# Patient Record
Sex: Male | Born: 1945 | Race: White | Hispanic: No | Smoking: Never smoker
Health system: Southern US, Community
[De-identification: ages and names within clinical notes are randomized; demographics above are authoritative.]

## PROBLEM LIST (undated history)

## (undated) DIAGNOSIS — I10 Essential (primary) hypertension: Secondary | ICD-10-CM

## (undated) DIAGNOSIS — K5792 Diverticulitis of intestine, part unspecified, without perforation or abscess without bleeding: Secondary | ICD-10-CM

## (undated) DIAGNOSIS — K219 Gastro-esophageal reflux disease without esophagitis: Secondary | ICD-10-CM

## (undated) DIAGNOSIS — Z8719 Personal history of other diseases of the digestive system: Secondary | ICD-10-CM

## (undated) DIAGNOSIS — N184 Chronic kidney disease, stage 4 (severe): Secondary | ICD-10-CM

## (undated) DIAGNOSIS — R222 Localized swelling, mass and lump, trunk: Secondary | ICD-10-CM

## (undated) DIAGNOSIS — I639 Cerebral infarction, unspecified: Secondary | ICD-10-CM

## (undated) DIAGNOSIS — M109 Gout, unspecified: Secondary | ICD-10-CM

## (undated) DIAGNOSIS — F419 Anxiety disorder, unspecified: Secondary | ICD-10-CM

## (undated) DIAGNOSIS — Z8711 Personal history of peptic ulcer disease: Secondary | ICD-10-CM

## (undated) DIAGNOSIS — E7800 Pure hypercholesterolemia, unspecified: Secondary | ICD-10-CM

## (undated) DIAGNOSIS — R011 Cardiac murmur, unspecified: Secondary | ICD-10-CM

## (undated) DIAGNOSIS — N289 Disorder of kidney and ureter, unspecified: Secondary | ICD-10-CM

---

## 1978-10-07 HISTORY — PX: KIDNEY SURGERY: SHX687

## 2000-05-13 ENCOUNTER — Ambulatory Visit (HOSPITAL_COMMUNITY): Admission: RE | Admit: 2000-05-13 | Discharge: 2000-05-13 | Payer: Self-pay | Admitting: Internal Medicine

## 2001-08-06 ENCOUNTER — Emergency Department (HOSPITAL_COMMUNITY): Admission: EM | Admit: 2001-08-06 | Discharge: 2001-08-06 | Payer: Self-pay | Admitting: Emergency Medicine

## 2004-09-19 ENCOUNTER — Ambulatory Visit (HOSPITAL_COMMUNITY): Admission: RE | Admit: 2004-09-19 | Discharge: 2004-09-19 | Payer: Self-pay | Admitting: Pulmonary Disease

## 2004-11-02 ENCOUNTER — Ambulatory Visit (HOSPITAL_COMMUNITY): Admission: RE | Admit: 2004-11-02 | Discharge: 2004-11-02 | Payer: Self-pay | Admitting: Nephrology

## 2008-01-14 ENCOUNTER — Observation Stay (HOSPITAL_COMMUNITY): Admission: EM | Admit: 2008-01-14 | Discharge: 2008-01-15 | Payer: Self-pay | Admitting: Emergency Medicine

## 2009-11-21 ENCOUNTER — Ambulatory Visit (HOSPITAL_COMMUNITY): Admission: RE | Admit: 2009-11-21 | Discharge: 2009-11-21 | Payer: Self-pay | Admitting: Pulmonary Disease

## 2010-06-20 NOTE — H&P (Signed)
NAMETERRAN, KLINKE                ACCOUNT NO.:  [id number]   MEDICAL RECORD NO.:  [id number]          PATIENT TYPE:  INP   LOCATION:  A323                          FACILITY:  [facility]   PHYSICIAN:  Edward L. Juanetta Gosling, M.D.DATE OF BIRTH:  1945-04-29   DATE OF ADMISSION:  01/14/2008  DATE OF DISCHARGE:  LH                              HISTORY & PHYSICAL   REASON FOR ADMISSION:  Syncope, probably secondary to gastroenteritis.   HISTORY:  Mr. Brule is a 65 year old, who came to the emergency room  because he passed out.  He says that he has been having nausea and  vomiting and diarrhea for about 48 hours.  He went to the bathroom and  passed out.  This after an episode of vomiting.  He says that he did not  have any change in his level of consciousness, after that he felt okay.  He did not have any seizure as far as he could tell.  Did not lose  continence.   PAST MEDICAL HISTORY:  Positive for hypertension, gastroesophageal  reflux disease, chronic renal failure.   SOCIAL HISTORY:  He is a nonsmoker.  Does not use any alcohol.  No  history of illicit drug use.  He apparently had some sort of a growth on  his kidney and I am not quite sure what that represents.   MEDICATIONS:  1. Hydrochlorothiazide 10 mg, I believe that must be 12.5 mg daily.  2. Omeprazole 20 mg daily.  3. Amlodipine 10 mg daily.  4. Enalapril 20 mg b.i.d.  5. Allopurinol 100 mg daily.  6. Hyzaar 100/25 mg daily.  7. Zocor 40 mg daily.  8. Added Prevacid, since he has been sick.   FAMILY HISTORY:  His mother has hypertension and is blind from macular  degeneration.   PHYSICAL EXAMINATION:  GENERAL:  Awake and alert, says that he feels  bad, he is still nauseated.  VITAL SIGNS:  Blood pressure 110/70, pulse 70 and regular, respirations  are 16.  He is afebrile.  His pupils are reactive.  His nose and throat  are clear.  Mucous membranes are dry.  NECK:  Supple without masses.  CHEST:  Rhonchi bilaterally.  HEART:  Regular without murmur, gallop, or rub.  ABDOMEN:  Mildly tender, diffusely with hyperactive bowel sounds.  EXTREMITIES:  No edema.  CENTRAL NERVOUS SYSTEM:  Grossly intact.   LAB WORK:  White count is 15200, hemoglobin is 15, platelets 162, BUN is  29, creatinine 2.2, amylase is pending.   Chest x-ray shows what looks like a large hiatal hernia, some  cardiomegaly.   ASSESSMENT:  It sounds like he has probably had syncope on the basis of  having had a gastroenteritis with nausea, vomiting, and diarrhea.  His  renal function does not look normal.  His white blood count is up.  I am  going to treat him conservatively for now with fluids.  I am not going  to put him on any antibiotics.  He is going to have a Zofran for nausea,  put him on a clear liquid diet, add  Protonix.  I am going to check an  amylase.  Recheck his CBC and see him in the morning, and if he has not  clinically improved, he will need to have a CT abdomen and pelvis.      Edward L. Juanetta Gosling, M.D.  Electronically Signed     ELH/MEDQ  D:  01/14/2008  T:  01/15/2008  Job:  [id number]

## 2010-06-20 NOTE — Group Therapy Note (Signed)
Charles Zuniga, Charles Zuniga                ACCOUNT NO.:  [id number]   MEDICAL RECORD NO.:  [id number]          PATIENT TYPE:  INP   LOCATION:  A323                          FACILITY:  [facility]   PHYSICIAN:  Edward L. Juanetta Gosling, M.D.DATE OF BIRTH:  04-23-1945   DATE OF PROCEDURE:  DATE OF DISCHARGE:                                 PROGRESS NOTE   HISTORY OF PRESENT ILLNESS:  Mr. Loppnow was brought in for observation  yesterday with what appeared to be a gastroenteritis.  He says he is  better.  He is not having any nausea.  He has not had any vomiting.  He  has not had any diarrhea.   PHYSICAL EXAMINATION:  GENERAL:  He looks more comfortable.  He is awake  and alert.  VITAL SIGNS:  Temperature is 98.7, pulse 71, respirations 20, blood  pressure 149/72, and O2 sat is 96%.  CHEST:  His chest is clear.  ABDOMEN:  Soft, and he looks much more comfortable.   LABORATORY DATA:  His white count has come down to 13,400 from 15,000,  and his renal function is marginally better from a BUN of 29 to a BUN of  26.  Creatinine from 2.2 to 2.17.  His CO2 which had been low is much  improved at 24.   ASSESSMENT:  He is better.   PLAN:  If he continues his improvement, I think he might be able to be  discharged later today.  I am going to advance his diet, get him up, and  see how he does.      Edward L. Juanetta Gosling, M.D.  Electronically Signed     ELH/MEDQ  D:  01/15/2008  T:  01/15/2008  Job:  [id number]

## 2010-06-23 NOTE — Discharge Summary (Signed)
NAMEADOLF, ORMISTON                ACCOUNT NO.:  [id number]   MEDICAL RECORD NO.:  [id number]          PATIENT TYPE:  OBV   LOCATION:  A323                          FACILITY:  [facility]   PHYSICIAN:  Edward L. Juanetta Gosling, M.D.DATE OF BIRTH:  Dec 22, 1945   DATE OF ADMISSION:  01/14/2008  DATE OF DISCHARGE:  12/10/2009LH                               DISCHARGE SUMMARY   FINAL DISCHARGE DIAGNOSES:  1. Syncope.  2. Gastroenteritis.  3. Dehydration.  4. Hypertension.  5. Gastroesophageal reflux disease.  6. Gout.  7. Hyperlipidemia.  8. Moderate chronic renal failure.   HISTORY:  Mr. Gartrell is a 65 year old who came to the emergency room  after passing out.  He has had nausea, vomiting and diarrhea for about  48 hours, went to the bathroom and passed out.  Before that, he had no  change in level of consciousness.  No seizure activity, etc.  He was  brought to the emergency room were he was noted to be awake and alert  but nauseated.  Blood pressure was 110/70, pulse was 70 and regular.  He  was afebrile.  His mucous membranes were dry.  His nose and throat were  clear.  The rest per his history and physical.  His white blood count  was 15,200, BUN was 29, creatinine 2.2.   HOSPITAL COURSE:  He was treated with IV fluids, antiemetics, and showed  marked improvement.  The next morning, his white blood count was  significantly better, and he looked significantly better.  He was able  to eat, he was able to keep his food down, and he was discharged to  continue with his previous medications which are:  1. Hydrochlorothiazide 12.5 mg daily.  2. Omeprazole 20 mg daily.  3. Amlodipine 10 mg daily.  4. Enalapril 20 mg b.i.d.  5. Allopurinol 100 mg daily.  6. Hyzaar 100/25 daily.  7. Zocor 40 mg daily.   He will follow up in my office in about a month.     Edward L. Juanetta Gosling, M.D.  Electronically Signed    ELH/MEDQ  D:  01/16/2008  T:  01/16/2008  Job:  [id number]

## 2010-10-05 ENCOUNTER — Other Ambulatory Visit (HOSPITAL_COMMUNITY): Payer: Self-pay | Admitting: Pulmonary Disease

## 2010-10-05 ENCOUNTER — Encounter (HOSPITAL_COMMUNITY): Payer: Self-pay

## 2010-10-05 ENCOUNTER — Ambulatory Visit (HOSPITAL_COMMUNITY)
Admission: RE | Admit: 2010-10-05 | Discharge: 2010-10-05 | Disposition: A | Discharge status: Home / Self Care | Payer: Medicare Other | Source: Ambulatory Visit | Attending: Pulmonary Disease | Admitting: Pulmonary Disease

## 2010-10-05 DIAGNOSIS — R269 Unspecified abnormalities of gait and mobility: Secondary | ICD-10-CM

## 2010-10-05 DIAGNOSIS — R42 Dizziness and giddiness: Secondary | ICD-10-CM

## 2010-10-05 HISTORY — DX: Essential (primary) hypertension: I10

## 2010-11-10 LAB — BASIC METABOLIC PANEL
CO2: 18 mEq/L — ABNORMAL LOW (ref 19–32)
Chloride: 110 mEq/L (ref 96–112)
GFR calc Af Amer: 37 mL/min — ABNORMAL LOW (ref >60)
Potassium: 4 mEq/L (ref 3.5–5.1)
Sodium: 139 mEq/L (ref 135–145)

## 2010-11-10 LAB — CBC
HCT: 43.5 % (ref 39.0–52.0)
Hemoglobin: 15 g/dL (ref 13.0–17.0)
MCHC: 33.7 g/dL (ref 30.0–36.0)
MCHC: 34.4 g/dL (ref 30.0–36.0)
MCV: 91.3 fL (ref 78.0–100.0)
MCV: 92.9 fL (ref 78.0–100.0)
Platelets: 162 10*3/uL (ref 150–400)
RBC: 4.77 MIL/uL (ref 4.22–5.81)
WBC: 13.4 10*3/uL — ABNORMAL HIGH (ref 4.0–10.5)

## 2010-11-10 LAB — POCT CARDIAC MARKERS
CKMB, poc: 1.5 ng/mL (ref 1.0–8.0)
Myoglobin, poc: 266 ng/mL (ref 12–200)

## 2010-11-10 LAB — DIFFERENTIAL
Basophils Relative: 0 % (ref 0–1)
Eosinophils Absolute: 0.1 10*3/uL (ref 0.0–0.7)
Eosinophils Relative: 1 % (ref 0–5)
Eosinophils Relative: 4 % (ref 0–5)
Lymphocytes Relative: 10 % — ABNORMAL LOW (ref 12–46)
Lymphs Abs: 1.3 10*3/uL (ref 0.7–4.0)
Monocytes Absolute: 1 10*3/uL (ref 0.1–1.0)
Monocytes Absolute: 1 10*3/uL (ref 0.1–1.0)
Monocytes Relative: 7 % (ref 3–12)
Neutro Abs: 13.7 10*3/uL — ABNORMAL HIGH (ref 1.7–7.7)

## 2010-11-10 LAB — COMPREHENSIVE METABOLIC PANEL
AST: 23 U/L (ref 0–37)
Albumin: 3 g/dL — ABNORMAL LOW (ref 3.5–5.2)
Calcium: 8.7 mg/dL (ref 8.4–10.5)
Chloride: 112 mEq/L (ref 96–112)
Creatinine, Ser: 2.17 mg/dL — ABNORMAL HIGH (ref 0.4–1.5)
GFR calc Af Amer: 37 mL/min — ABNORMAL LOW (ref >60)
Total Protein: 5.6 g/dL — ABNORMAL LOW (ref 6.0–8.3)

## 2010-11-10 LAB — URINALYSIS, ROUTINE W REFLEX MICROSCOPIC
Bilirubin Urine: NEGATIVE
Specific Gravity, Urine: 1.02 (ref 1.005–1.030)
Urobilinogen, UA: 0.2 mg/dL (ref 0.0–1.0)

## 2010-11-10 LAB — URINE MICROSCOPIC-ADD ON

## 2011-03-14 ENCOUNTER — Other Ambulatory Visit: Payer: Self-pay

## 2011-03-14 ENCOUNTER — Emergency Department (HOSPITAL_COMMUNITY)
Admission: EM | Admit: 2011-03-14 | Discharge: 2011-03-14 | Disposition: A | Discharge status: Home / Self Care | Payer: Medicare Other | Attending: Emergency Medicine | Admitting: Emergency Medicine

## 2011-03-14 ENCOUNTER — Encounter (HOSPITAL_COMMUNITY): Payer: Self-pay | Admitting: *Deleted

## 2011-03-14 DIAGNOSIS — I10 Essential (primary) hypertension: Secondary | ICD-10-CM

## 2011-03-14 DIAGNOSIS — N289 Disorder of kidney and ureter, unspecified: Secondary | ICD-10-CM | POA: Insufficient documentation

## 2011-03-14 DIAGNOSIS — R42 Dizziness and giddiness: Secondary | ICD-10-CM | POA: Insufficient documentation

## 2011-03-14 HISTORY — DX: Disorder of kidney and ureter, unspecified: N28.9

## 2011-03-14 LAB — BASIC METABOLIC PANEL
BUN: 26 mg/dL — ABNORMAL HIGH (ref 6–23)
CO2: 25 mEq/L (ref 19–32)
Calcium: 10 mg/dL (ref 8.4–10.5)
Creatinine, Ser: 2.34 mg/dL — ABNORMAL HIGH (ref 0.50–1.35)

## 2011-03-14 LAB — CBC
HCT: 43.3 % (ref 39.0–52.0)
MCHC: 33.3 g/dL (ref 30.0–36.0)
MCV: 90.8 fL (ref 78.0–100.0)
RDW: 13.7 % (ref 11.5–15.5)

## 2011-03-14 LAB — URINALYSIS, ROUTINE W REFLEX MICROSCOPIC
Bilirubin Urine: NEGATIVE
Glucose, UA: 100 mg/dL — AB
Ketones, ur: NEGATIVE mg/dL
Leukocytes, UA: NEGATIVE
Protein, ur: >300 mg/dL — AB

## 2011-03-14 LAB — DIFFERENTIAL
Basophils Absolute: 0.1 10*3/uL (ref 0.0–0.1)
Basophils Relative: 1 % (ref 0–1)
Eosinophils Relative: 6 % — ABNORMAL HIGH (ref 0–5)
Lymphocytes Relative: 16 % (ref 12–46)
Monocytes Absolute: 0.6 10*3/uL (ref 0.1–1.0)

## 2011-03-14 LAB — URINE MICROSCOPIC-ADD ON

## 2011-03-14 MED ORDER — METOPROLOL TARTRATE 1 MG/ML IV SOLN
5.0000 mg | Freq: Once | INTRAVENOUS | Status: AC
  Administered 2011-03-14: 5 mg via INTRAVENOUS
  Filled 2011-03-14: qty 5

## 2011-03-14 MED ORDER — CLONIDINE HCL 0.1 MG PO TABS
0.1000 mg | ORAL_TABLET | Freq: Once | ORAL | Status: AC
  Administered 2011-03-14: 0.1 mg via ORAL
  Filled 2011-03-14: qty 1

## 2011-03-14 MED ORDER — CLONIDINE HCL 0.2 MG PO TABS
0.1000 mg | ORAL_TABLET | Freq: Two times a day (BID) | ORAL | Status: DC
Start: 2011-03-14 — End: 2011-07-27

## 2011-03-14 MED ORDER — SODIUM CHLORIDE 0.9 % IV SOLN
Freq: Once | INTRAVENOUS | Status: AC
  Administered 2011-03-14: 15:00:00 via INTRAVENOUS

## 2011-03-14 NOTE — ED Notes (Signed)
BP improved.  Pt denies complaints at present time.  No needs verbalized at present.

## 2011-03-14 NOTE — ED Provider Notes (Signed)
This chart was scribed for [employer]. Colon Branch, MD by Wallis Mart. The patient was seen in room APA05/APA05 and the patient's care was started at 3:47 PM.   CSN: [id number]  Arrival date & time 03/14/11  1432   First MD Initiated Contact with Patient 03/14/11 1507      Chief Complaint  Patient presents with  . Hypertension    (Consider location/radiation/quality/duration/timing/severity/associated sxs/prior treatment) HPI Charles Zuniga is a 66 y.o. male who presents to the Emergency Department complaining of intermittent hypertension (240/108) that was noticed this morning at his kidney MD appt. PT bp is currently 139/118.  Pt last checked bp 3 weeks ago, and it was a "little high". Pt takes Hydralizine.  Pt was taken off 4  bp medications after switching PCPs.  Pt c/o dizzy spells to the extent that he couldn't walk for a month.  Pt still c/o dizziness in the morning.    PCP: Dr Creta Levin [facility] [place]) Kidney doctor: Dr Fausto Skillern  Past Medical History  Diagnosis Date  . Hypertension   . Renal disorder     Past Surgical History  Procedure Date  . Kidney surgery     History reviewed. No pertinent family history.  History  Substance Use Topics  . Smoking status: Never Smoker   . Smokeless tobacco: Not on file  . Alcohol Use: No      Review of Systems  10 Systems reviewed and are negative for acute change except as noted in the HPI.  Allergies  Review of patient's allergies indicates no known allergies.  Home Medications   Current Outpatient Rx  Name Route Sig Dispense Refill  . ALLOPURINOL 100 MG PO TABS Oral Take 200 mg by mouth every morning.    Marland Kitchen HYDRALAZINE HCL 50 MG PO TABS Oral Take 50 mg by mouth 2 (two) times daily.    Marland Kitchen PANTOPRAZOLE SODIUM 40 MG PO TBEC Oral Take 40 mg by mouth every morning.      BP 220/105  Pulse 72  Temp(Src) 97.5 F (36.4 C) (Oral)  Resp 20  Ht 5\' 9"  (1.753 m)  Wt 179 lb (81.194 kg)  BMI 26.43 kg/m2  SpO2 100%  Physical  Exam  Nursing note and vitals reviewed. Constitutional: He is oriented to person, place, and time. He appears well-developed and well-nourished. No distress.  HENT:  Head: Normocephalic and atraumatic.       Poor dentition  Eyes: EOM are normal. Pupils are equal, round, and reactive to light.  Neck: Normal range of motion. Neck supple. No tracheal deviation present.       No bruits  Cardiovascular: Normal rate, regular rhythm and normal heart sounds.  Exam reveals no friction rub.   No murmur heard. Pulmonary/Chest: Effort normal. No respiratory distress. He has no wheezes. He has no rales.        Few fine crackles at bases of lungs  Abdominal: Soft. Bowel sounds are normal. He exhibits no distension.  Musculoskeletal: Normal range of motion. He exhibits no edema.       excoriated erythemas rash to both shins constistant with eczema  Neurological: He is alert and oriented to person, place, and time. No sensory deficit.  Skin: Skin is warm and dry.  Psychiatric: He has a normal mood and affect. His behavior is normal.    ED Course  Procedures (including critical care time) DIAGNOSTIC STUDIES: Oxygen Saturation is 100% on room air, normal by my interpretation.    COORDINATION OF CARE:  3:55 PM: Physical exam complete  6:22 PM: EDP at pt bedside, PT BP currently 199/184  8:03 PM: EDP at pt bedside.  Current BP: 118/52.  EDP discuss potential medications and course of treatment.    Results for orders placed during the hospital encounter of 03/14/11  CBC      Component Value Range   WBC 6.1  4.0 - 10.5 (K/uL)   RBC 4.77  4.22 - 5.81 (MIL/uL)   Hemoglobin 14.4  13.0 - 17.0 (g/dL)   HCT 13.0  86.5 - 78.4 (%)   MCV 90.8  78.0 - 100.0 (fL)   MCH 30.2  26.0 - 34.0 (pg)   MCHC 33.3  30.0 - 36.0 (g/dL)   RDW 69.6  29.5 - 28.4 (%)   Platelets 156  150 - 400 (K/uL)  DIFFERENTIAL      Component Value Range   Neutrophils Relative 67  43 - 77 (%)   Neutro Abs 4.1  1.7 - 7.7 (K/uL)    Lymphocytes Relative 16  12 - 46 (%)   Lymphs Abs 1.0  0.7 - 4.0 (K/uL)   Monocytes Relative 11  3 - 12 (%)   Monocytes Absolute 0.6  0.1 - 1.0 (K/uL)   Eosinophils Relative 6 (*) 0 - 5 (%)   Eosinophils Absolute 0.3  0.0 - 0.7 (K/uL)   Basophils Relative 1  0 - 1 (%)   Basophils Absolute 0.1  0.0 - 0.1 (K/uL)  BASIC METABOLIC PANEL      Component Value Range   Sodium 139  135 - 145 (mEq/L)   Potassium 3.7  3.5 - 5.1 (mEq/L)   Chloride 104  96 - 112 (mEq/L)   CO2 25  19 - 32 (mEq/L)   Glucose, Bld 91  70 - 99 (mg/dL)   BUN 26 (*) 6 - 23 (mg/dL)   Creatinine, Ser 1.32 (*) 0.50 - 1.35 (mg/dL)   Calcium 44.0  8.4 - 10.5 (mg/dL)   GFR calc non Af Amer 27 (*) >90 (mL/min)   GFR calc Af Amer 32 (*) >90 (mL/min)     Date: 03/14/2011  1508  Rate: 67  Rhythm: normal sinus rhythm  QRS Axis: normal  Intervals: normal  ST/T Wave abnormalities: normal  Conduction Disutrbances:none  Narrative Interpretation:   Old EKG Reviewed: none available      MDM  Patient with renal insufficiency and HTN  Here with elevated blood pressure while at the nephrology office and sent to the ER for evaluation. Patient had previously been on multiple medicines for HTN and was taken off all but hydralazine by his new PCP. Now with BP 256/110. Denies headache, vision changes, trouble speaking or swallowing. Given lopressor  And clonidine with improvement in the blood pressure. Will send home on clonidine daily and allow PCP to regulate BP.The patient appears reasonably screened and/or stabilized for discharge and I doubt any other medical condition or other [facility] requiring further screening, evaluation, or treatment in the ED at this time prior to discharge.    I personally performed the services described in this documentation, which was scribed in my presence. The recorded information has been reviewed and considered.  MDM Reviewed: vitals and nursing note Interpretation: labs      Nicoletta Dress. Colon Branch,  MD 03/14/11 2023

## 2011-03-14 NOTE — ED Notes (Addendum)
Pt states that he went to his kidney MD and his blood pressure 240/108. Pt told to come to ED. Pt also c/o dizziness when he first gets up in the morning.

## 2011-07-27 ENCOUNTER — Emergency Department (HOSPITAL_COMMUNITY): Payer: Medicare Other

## 2011-07-27 ENCOUNTER — Emergency Department (HOSPITAL_COMMUNITY)
Admission: EM | Admit: 2011-07-27 | Discharge: 2011-07-27 | Disposition: A | Discharge status: Home / Self Care | Payer: Medicare Other | Source: Home / Self Care | Attending: Emergency Medicine | Admitting: Emergency Medicine

## 2011-07-27 ENCOUNTER — Encounter (HOSPITAL_COMMUNITY): Payer: Self-pay

## 2011-07-27 DIAGNOSIS — I10 Essential (primary) hypertension: Secondary | ICD-10-CM | POA: Insufficient documentation

## 2011-07-27 DIAGNOSIS — R61 Generalized hyperhidrosis: Secondary | ICD-10-CM | POA: Insufficient documentation

## 2011-07-27 DIAGNOSIS — K5732 Diverticulitis of large intestine without perforation or abscess without bleeding: Secondary | ICD-10-CM | POA: Insufficient documentation

## 2011-07-27 DIAGNOSIS — K5792 Diverticulitis of intestine, part unspecified, without perforation or abscess without bleeding: Secondary | ICD-10-CM

## 2011-07-27 DIAGNOSIS — R197 Diarrhea, unspecified: Secondary | ICD-10-CM | POA: Insufficient documentation

## 2011-07-27 DIAGNOSIS — R110 Nausea: Secondary | ICD-10-CM | POA: Insufficient documentation

## 2011-07-27 DIAGNOSIS — Z79899 Other long term (current) drug therapy: Secondary | ICD-10-CM | POA: Insufficient documentation

## 2011-07-27 DIAGNOSIS — R040 Epistaxis: Secondary | ICD-10-CM | POA: Insufficient documentation

## 2011-07-27 DIAGNOSIS — R1032 Left lower quadrant pain: Secondary | ICD-10-CM | POA: Insufficient documentation

## 2011-07-27 LAB — PROTIME-INR
INR: 0.91 (ref 0.00–1.49)
Prothrombin Time: 12.5 seconds (ref 11.6–15.2)

## 2011-07-27 LAB — CBC
HCT: 39.4 % (ref 39.0–52.0)
MCHC: 34.8 g/dL (ref 30.0–36.0)
MCV: 90.4 fL (ref 78.0–100.0)
RDW: 13.4 % (ref 11.5–15.5)

## 2011-07-27 LAB — COMPREHENSIVE METABOLIC PANEL
AST: 18 U/L (ref 0–37)
Albumin: 4.5 g/dL (ref 3.5–5.2)
Calcium: 10.7 mg/dL — ABNORMAL HIGH (ref 8.4–10.5)
Creatinine, Ser: 3.14 mg/dL — ABNORMAL HIGH (ref 0.50–1.35)

## 2011-07-27 LAB — DIFFERENTIAL
Basophils Absolute: 0.1 10*3/uL (ref 0.0–0.1)
Basophils Relative: 1 % (ref 0–1)
Eosinophils Relative: 4 % (ref 0–5)
Monocytes Absolute: 0.9 10*3/uL (ref 0.1–1.0)
Neutro Abs: 5.9 10*3/uL (ref 1.7–7.7)

## 2011-07-27 MED ORDER — HYDROMORPHONE HCL PF 1 MG/ML IJ SOLN
1.0000 mg | Freq: Once | INTRAMUSCULAR | Status: AC
  Administered 2011-07-27: 1 mg via INTRAVENOUS
  Filled 2011-07-27: qty 1

## 2011-07-27 MED ORDER — ONDANSETRON HCL 4 MG/2ML IJ SOLN
4.0000 mg | Freq: Once | INTRAMUSCULAR | Status: AC
  Administered 2011-07-27: 4 mg via INTRAVENOUS
  Filled 2011-07-27: qty 2

## 2011-07-27 MED ORDER — HYDROCODONE-ACETAMINOPHEN 5-325 MG PO TABS: 1.0000 | ORAL_TABLET | Freq: Four times a day (QID) | ORAL | Status: DC | PRN

## 2011-07-27 MED ORDER — AMOXICILLIN-POT CLAVULANATE 875-125 MG PO TABS: 1.0000 | ORAL_TABLET | Freq: Two times a day (BID) | ORAL | Status: DC

## 2011-07-27 MED ORDER — SODIUM CHLORIDE 0.9 % IV SOLN
3.0000 g | Freq: Once | INTRAVENOUS | Status: AC
  Administered 2011-07-27: 3 g via INTRAVENOUS
  Filled 2011-07-27: qty 3

## 2011-07-27 MED ORDER — SODIUM CHLORIDE 0.9 % IV SOLN
INTRAVENOUS | Status: DC
  Administered 2011-07-27: 500 mL via INTRAVENOUS

## 2011-07-27 MED ORDER — SODIUM CHLORIDE 0.9 % IV BOLUS (SEPSIS)
500.0000 mL | Freq: Once | INTRAVENOUS | Status: AC
  Administered 2011-07-27: 500 mL via INTRAVENOUS

## 2011-07-27 NOTE — ED Provider Notes (Addendum)
History    This chart was scribed for Charles Jakes, MD, MD by Charles Zuniga. The patient was seen in room APA15 and the patient's care was started at 10:20AM.   CSN: [id number]  Arrival date & time 07/27/11  1191   First MD Initiated Contact with Patient 07/27/11 1002      Chief Complaint  Patient presents with  . Diarrhea  . Abdominal Pain    (Consider location/radiation/quality/duration/timing/severity/associated sxs/prior treatment) The history is provided by the patient.   Charles Zuniga is a 66 y.o. male who presents to the Emergency Department complaining of moderate left lower quadrant abdominal pain and diarrhea onset 2 days. Pain is sharp. Pain has been constant without radiation. Pt has nausea. Denies vomiting. Pt had nose bleed this morning. He has had diaphoresis last night while he was sleeping. Denies blood in his stool.  PCP Dr. Creta Levin in [place]   Past Medical History  Diagnosis Date  . Hypertension   . Renal disorder     Past Surgical History  Procedure Date  . Kidney surgery     No family history on file.  History  Substance Use Topics  . Smoking status: Never Smoker   . Smokeless tobacco: Not on file  . Alcohol Use: No      Review of Systems  Constitutional: Negative for fever and chills.  HENT: Negative for congestion, sore throat and neck pain.   Respiratory: Negative for shortness of breath.   Gastrointestinal: Positive for nausea, abdominal pain and diarrhea. Negative for vomiting.  Genitourinary: Negative for dysuria.  Musculoskeletal: Negative for back pain.  Skin: Negative for rash.    Allergies  Clonidine derivatives  Home Medications   Current Outpatient Rx  Name Route Sig Dispense Refill  . ALLOPURINOL 100 MG PO TABS Oral Take 200 mg by mouth every morning.    Marland Kitchen HYDRALAZINE HCL 50 MG PO TABS Oral Take 50 mg by mouth 2 (two) times daily.    Marland Kitchen PANTOPRAZOLE SODIUM 40 MG PO TBEC Oral Take 40 mg by mouth every morning.     Marland Kitchen AMOXICILLIN-POT CLAVULANATE 875-125 MG PO TABS Oral Take 1 tablet by mouth every 12 (twelve) hours. 14 tablet 0  . HYDROCODONE-ACETAMINOPHEN 5-325 MG PO TABS Oral Take 1-2 tablets by mouth every 6 (six) hours as needed for pain. 10 tablet 0    BP 133/63  Pulse 65  Temp 98.4 F (36.9 C) (Oral)  Resp 18  SpO2 97%  Physical Exam  Nursing note and vitals reviewed. Constitutional: He is oriented to person, place, and time. He appears well-developed and well-nourished. No distress.  HENT:  Head: Normocephalic and atraumatic.       Clot in left nare   Eyes: EOM are normal.  Cardiovascular: Normal rate, regular rhythm and normal heart sounds.   Pulmonary/Chest: Effort normal and breath sounds normal. No respiratory distress.  Abdominal: Soft. Bowel sounds are normal. He exhibits no distension. There is tenderness (LLQ).  Neurological: He is alert and oriented to person, place, and time.  Skin: Skin is warm and dry.  Psychiatric: He has a normal mood and affect. His behavior is normal.    ED Course  Procedures (including critical care time) DIAGNOSTIC STUDIES: Oxygen Saturation is 99% on room air, normal by my interpretation.    COORDINATION OF CARE: 10:25AM EDP discusses pt ED treatment with pt 10:32AM EDP orders medication: 0.9% NaCl infusion, Zofran 4 mg, dilaudid 1 mg 12:47PM EDP rechecks pt and discusses pt  lab/imaging results with pt.    Labs Reviewed  COMPREHENSIVE METABOLIC PANEL - Abnormal; Notable for the following:    Glucose, Bld 111 (*)     BUN 47 (*)     Creatinine, Ser 3.14 (*)     Calcium 10.7 (*)     Total Protein 8.7 (*)     GFR calc non Af Amer 19 (*)     GFR calc Af Amer 22 (*)     All other components within normal limits  CBC  DIFFERENTIAL  LIPASE, BLOOD  PROTIME-INR   Ct Abdomen Pelvis Wo Contrast  07/27/2011  *RADIOLOGY REPORT*  Clinical Data: Left-sided abdominal pain for 3 days.  CT ABDOMEN AND PELVIS WITHOUT CONTRAST  Technique:   Multidetector CT imaging of the abdomen and pelvis was performed following the standard protocol without intravenous contrast.  Comparison: 11/21/2009  Findings: There is focal diverticulitis at the junction of the descending and sigmoid portions of the colon with pericolonic inflammation but no abscess or perforation.  The patient has extensive diverticula in the descending and sigmoid portions of the colon.  The liver, spleen, pancreas, adrenal glands, and kidneys demonstrate no acute abnormalities.  There is deformity of the right kidney with a cyst on the upper pole.  The appearance is unchanged since the prior exam.  Appendix is normal.  There is a right inguinal hernia containing only fat.  IMPRESSION: Focal acute diverticulitis of the distal descending colon.  No perforation or abscess.  Extensive diverticulosis of the left side of the colon.  Original Report Authenticated By: Gwynn Burly, M.D.   Results for orders placed during the hospital encounter of 07/27/11  CBC      Component Value Range   WBC 8.4  4.0 - 10.5 K/uL   RBC 4.36  4.22 - 5.81 MIL/uL   Hemoglobin 13.7  13.0 - 17.0 g/dL   HCT 16.1  09.6 - 04.5 %   MCV 90.4  78.0 - 100.0 fL   MCH 31.4  26.0 - 34.0 pg   MCHC 34.8  30.0 - 36.0 g/dL   RDW 40.9  81.1 - 91.4 %   Platelets 192  150 - 400 K/uL  DIFFERENTIAL      Component Value Range   Neutrophils Relative 70  43 - 77 %   Neutro Abs 5.9  1.7 - 7.7 K/uL   Lymphocytes Relative 14  12 - 46 %   Lymphs Abs 1.1  0.7 - 4.0 K/uL   Monocytes Relative 11  3 - 12 %   Monocytes Absolute 0.9  0.1 - 1.0 K/uL   Eosinophils Relative 4  0 - 5 %   Eosinophils Absolute 0.4  0.0 - 0.7 K/uL   Basophils Relative 1  0 - 1 %   Basophils Absolute 0.1  0.0 - 0.1 K/uL  COMPREHENSIVE METABOLIC PANEL      Component Value Range   Sodium 139  135 - 145 mEq/L   Potassium 3.8  3.5 - 5.1 mEq/L   Chloride 99  96 - 112 mEq/L   CO2 23  19 - 32 mEq/L   Glucose, Bld 111 (*) 70 - 99 mg/dL   BUN 47 (*)  6 - 23 mg/dL   Creatinine, Ser 7.82 (*) 0.50 - 1.35 mg/dL   Calcium 95.6 (*) 8.4 - 10.5 mg/dL   Total Protein 8.7 (*) 6.0 - 8.3 g/dL   Albumin 4.5  3.5 - 5.2 g/dL   AST 18  0 -  37 U/L   ALT 14  0 - 53 U/L   Alkaline Phosphatase 88  39 - 117 U/L   Total Bilirubin 0.7  0.3 - 1.2 mg/dL   GFR calc non Af Amer 19 (*) >90 mL/min   GFR calc Af Amer 22 (*) >90 mL/min  LIPASE, BLOOD      Component Value Range   Lipase 36  11 - 59 U/L  PROTIME-INR      Component Value Range   Prothrombin Time 12.5  11.6 - 15.2 seconds   INR 0.91  0.00 - 1.49     1. Diverticulitis       MDM  CT consistent with uncomplicated diverticulitis treated with 3 g of Unasyn in the emergency department we'll continue Augmentin at home for the next 7 days. Patient is qualified to be treated as an outpatient patient's had a history of a renal disorder the increased BUN and creatinine from baseline may be consistent with some dehydration patient given fluid in the emergency department. Has primary care Dr. followup next week knows to return if not better in 2 days or for new worse symptoms. History of diabetes blood sugar here today is 111 well-controlled.   I personally performed the services described in this documentation, which was scribed in my presence. The recorded information has been reviewed and considered.     Charles Jakes, MD 07/27/11 1322probably not  Charles Jakes, MD 07/27/11 2104

## 2011-07-27 NOTE — ED Notes (Signed)
Patient is comfortable at this time. 

## 2011-07-27 NOTE — ED Notes (Signed)
Family at bedside. 

## 2011-07-27 NOTE — ED Notes (Signed)
Pt reports left lower ab pain and diarrhea for 2-3 days, denies any fever no n/v.  Also had nosebleed this am, bp has been high and pmd is aware and is treating.

## 2011-07-27 NOTE — Discharge Instructions (Signed)
Diverticulitis Small pockets or "bubbles" can develop in the wall of the intestine. Diverticulitis is when those pockets become infected and inflamed. This causes stomach pain (usually on the left side). HOME CARE  Take all medicine as told by your doctor.   Try a clear liquid diet (broth, tea, or water) for as long as told by your doctor.   Keep all follow-up visits with your doctor.   You may be put on a low-fiber diet once you start feeling better. Here are foods that have low-fiber:   White breads, cereals, rice, and pasta.   Cooked fruits and vegetables or soft fresh fruits and vegetables without the skin.   Ground or well-cooked tender beef, ham, veal, lamb, pork, or poultry.   Eggs and seafood.   After you are doing well on the low-fiber diet, you may be put on a high-fiber diet. Here are ways to increase your fiber:   Choose whole-grain breads, cereals, pasta, and brown rice.   Choose fruits and vegetables with skin on. Do not overcook the vegetables.   Choose nuts, seeds, legumes, dried peas, beans, and lentils.   Look for food products that have more than 3 grams of fiber per serving on the food label.  GET HELP RIGHT AWAY IF:  Your pain does not get better or gets worse.   You have trouble eating food.   You are not pooping (having bowel movements) like normal.   You have a temperature by mouth above 102 F (38.9 C), not controlled by medicine.   You keep throwing up (vomiting).   You have bloody or black, tarry poop (stools).   You are getting worse and not better.  MAKE SURE YOU:   Understand these instructions.   Will watch your condition.   Will get help right away if you are not doing well or get worse.  Document Released: 07/11/2007 Document Revised: 01/11/2011 Document Reviewed: 12/13/2008 [facility] Patient Information 2012 [place], [place].  followup with your regular Dr.in the next few days. Take antibiotic as directed take pain medicine as  directed. You should be better or improving significantly in 2 days if not she needs a followup and return here for new or worse symptoms. Definitely need to followup with your primary care Dr. Next week sometime.

## 2011-07-27 NOTE — ED Notes (Signed)
Pt is resting in bed --resp even awaiting dischage

## 2011-07-28 ENCOUNTER — Inpatient Hospital Stay (HOSPITAL_COMMUNITY)
Admission: EM | Admit: 2011-07-28 | Discharge: 2011-07-30 | DRG: 392 | Disposition: A | Discharge status: Home / Self Care | Payer: Medicare Other | Attending: Internal Medicine | Admitting: Internal Medicine

## 2011-07-28 ENCOUNTER — Encounter (HOSPITAL_COMMUNITY): Payer: Self-pay

## 2011-07-28 DIAGNOSIS — Z8639 Personal history of other endocrine, nutritional and metabolic disease: Secondary | ICD-10-CM

## 2011-07-28 DIAGNOSIS — E860 Dehydration: Secondary | ICD-10-CM | POA: Diagnosis present

## 2011-07-28 DIAGNOSIS — I10 Essential (primary) hypertension: Secondary | ICD-10-CM

## 2011-07-28 DIAGNOSIS — E119 Type 2 diabetes mellitus without complications: Secondary | ICD-10-CM | POA: Diagnosis present

## 2011-07-28 DIAGNOSIS — K5732 Diverticulitis of large intestine without perforation or abscess without bleeding: Secondary | ICD-10-CM

## 2011-07-28 DIAGNOSIS — Z862 Personal history of diseases of the blood and blood-forming organs and certain disorders involving the immune mechanism: Secondary | ICD-10-CM

## 2011-07-28 DIAGNOSIS — I129 Hypertensive chronic kidney disease with stage 1 through stage 4 chronic kidney disease, or unspecified chronic kidney disease: Secondary | ICD-10-CM | POA: Diagnosis present

## 2011-07-28 DIAGNOSIS — N189 Chronic kidney disease, unspecified: Secondary | ICD-10-CM

## 2011-07-28 DIAGNOSIS — K219 Gastro-esophageal reflux disease without esophagitis: Secondary | ICD-10-CM | POA: Diagnosis present

## 2011-07-28 DIAGNOSIS — N184 Chronic kidney disease, stage 4 (severe): Secondary | ICD-10-CM | POA: Diagnosis present

## 2011-07-28 DIAGNOSIS — R1032 Left lower quadrant pain: Secondary | ICD-10-CM | POA: Diagnosis present

## 2011-07-28 DIAGNOSIS — E785 Hyperlipidemia, unspecified: Secondary | ICD-10-CM | POA: Diagnosis present

## 2011-07-28 DIAGNOSIS — K5792 Diverticulitis of intestine, part unspecified, without perforation or abscess without bleeding: Secondary | ICD-10-CM

## 2011-07-28 DIAGNOSIS — R109 Unspecified abdominal pain: Secondary | ICD-10-CM

## 2011-07-28 HISTORY — DX: Diverticulitis of intestine, part unspecified, without perforation or abscess without bleeding: K57.92

## 2011-07-28 HISTORY — DX: Gout, unspecified: M10.9

## 2011-07-28 HISTORY — DX: Chronic kidney disease, stage 4 (severe): N18.4

## 2011-07-28 LAB — CBC
Hemoglobin: 14 g/dL (ref 13.0–17.0)
RBC: 4.46 MIL/uL (ref 4.22–5.81)

## 2011-07-28 LAB — DIFFERENTIAL
Basophils Relative: 1 % (ref 0–1)
Lymphs Abs: 1 10*3/uL (ref 0.7–4.0)
Monocytes Relative: 10 % (ref 3–12)
Neutro Abs: 4.3 10*3/uL (ref 1.7–7.7)
Neutrophils Relative %: 67 % (ref 43–77)

## 2011-07-28 LAB — BASIC METABOLIC PANEL
BUN: 44 mg/dL — ABNORMAL HIGH (ref 6–23)
Chloride: 96 mEq/L (ref 96–112)
GFR calc Af Amer: 23 mL/min — ABNORMAL LOW (ref >90)
Glucose, Bld: 117 mg/dL — ABNORMAL HIGH (ref 70–99)
Potassium: 4 mEq/L (ref 3.5–5.1)

## 2011-07-28 MED ORDER — SODIUM CHLORIDE 0.9 % IV SOLN
Freq: Once | INTRAVENOUS | Status: AC
  Administered 2011-07-28: 20 mL/h via INTRAVENOUS

## 2011-07-28 MED ORDER — ONDANSETRON HCL 4 MG/2ML IJ SOLN
4.0000 mg | Freq: Four times a day (QID) | INTRAMUSCULAR | Status: DC | PRN
  Administered 2011-07-29: 4 mg via INTRAVENOUS
  Filled 2011-07-28: qty 2

## 2011-07-28 MED ORDER — ALLOPURINOL 100 MG PO TABS
200.0000 mg | ORAL_TABLET | Freq: Every morning | ORAL | Status: DC
  Administered 2011-07-29 – 2011-07-30 (×2): 200 mg via ORAL
  Filled 2011-07-28 (×2): qty 2

## 2011-07-28 MED ORDER — MORPHINE SULFATE 4 MG/ML IJ SOLN
4.0000 mg | Freq: Once | INTRAMUSCULAR | Status: AC
  Administered 2011-07-28: 4 mg via INTRAVENOUS
  Filled 2011-07-28: qty 1

## 2011-07-28 MED ORDER — ONDANSETRON HCL 4 MG PO TABS: 4.0000 mg | ORAL_TABLET | Freq: Four times a day (QID) | ORAL | Status: DC | PRN

## 2011-07-28 MED ORDER — METRONIDAZOLE IN NACL 5-0.79 MG/ML-% IV SOLN
INTRAVENOUS | Status: AC
  Filled 2011-07-28: qty 200

## 2011-07-28 MED ORDER — MORPHINE SULFATE 2 MG/ML IJ SOLN: 1.0000 mg | INTRAMUSCULAR | Status: DC | PRN

## 2011-07-28 MED ORDER — CIPROFLOXACIN IN D5W 400 MG/200ML IV SOLN
INTRAVENOUS | Status: AC
  Filled 2011-07-28: qty 200

## 2011-07-28 MED ORDER — SODIUM CHLORIDE 0.9 % IV SOLN
3.0000 g | Freq: Once | INTRAVENOUS | Status: AC
  Administered 2011-07-28: 3 g via INTRAVENOUS
  Filled 2011-07-28: qty 3

## 2011-07-28 MED ORDER — METRONIDAZOLE IN NACL 5-0.79 MG/ML-% IV SOLN
500.0000 mg | Freq: Three times a day (TID) | INTRAVENOUS | Status: DC
  Administered 2011-07-28 – 2011-07-30 (×5): 500 mg via INTRAVENOUS
  Filled 2011-07-28 (×6): qty 100

## 2011-07-28 MED ORDER — OXYCODONE-ACETAMINOPHEN 5-325 MG PO TABS
1.0000 | ORAL_TABLET | Freq: Four times a day (QID) | ORAL | Status: DC | PRN
  Administered 2011-07-28 – 2011-07-29 (×2): 2 via ORAL
  Filled 2011-07-28 (×3): qty 2

## 2011-07-28 MED ORDER — HYDRALAZINE HCL 25 MG PO TABS
50.0000 mg | ORAL_TABLET | Freq: Two times a day (BID) | ORAL | Status: DC
  Administered 2011-07-28 – 2011-07-30 (×4): 50 mg via ORAL
  Filled 2011-07-28: qty 1
  Filled 2011-07-28 (×4): qty 2

## 2011-07-28 MED ORDER — POTASSIUM CHLORIDE IN NACL 20-0.9 MEQ/L-% IV SOLN
INTRAVENOUS | Status: AC
  Administered 2011-07-28: 23:00:00 via INTRAVENOUS

## 2011-07-28 MED ORDER — CIPROFLOXACIN IN D5W 400 MG/200ML IV SOLN
400.0000 mg | Freq: Two times a day (BID) | INTRAVENOUS | Status: DC
  Administered 2011-07-28: 400 mg via INTRAVENOUS
  Filled 2011-07-28 (×4): qty 200

## 2011-07-28 MED ORDER — PANTOPRAZOLE SODIUM 40 MG PO TBEC
40.0000 mg | DELAYED_RELEASE_TABLET | Freq: Every morning | ORAL | Status: DC
  Administered 2011-07-29 – 2011-07-30 (×2): 40 mg via ORAL
  Filled 2011-07-28 (×2): qty 1

## 2011-07-28 MED ORDER — CIPROFLOXACIN IN D5W 400 MG/200ML IV SOLN
400.0000 mg | Freq: Once | INTRAVENOUS | Status: DC
  Filled 2011-07-28: qty 200

## 2011-07-28 MED ORDER — ONDANSETRON HCL 4 MG/2ML IJ SOLN
4.0000 mg | Freq: Once | INTRAMUSCULAR | Status: AC
  Administered 2011-07-28: 4 mg via INTRAVENOUS
  Filled 2011-07-28: qty 2

## 2011-07-28 NOTE — ED Notes (Signed)
Pt states he was here last night for abd pain. States pain is worse today

## 2011-07-28 NOTE — ED Provider Notes (Signed)
History   This chart was scribed for Joya Gaskins, MD by Melba Coon. The patient was seen in room APA12/APA12 and the patient's care was started at 6:52PM.    CSN: [id number]  Arrival date & time 07/28/11  1629   First MD Initiated Contact with Patient 07/28/11 1821      Chief Complaint  Patient presents with  . Abdominal Pain     HPI Charles Zuniga is a 66 y.o. male who presents to the Emergency Department complaining of constant, moderate to severe abdominal pain with an onset 2 days ago. Pt has Hx of abd problems; was in the ED yesterday for similar issues; pain has not gotten better since last visit. No abd surgeries.  Nausea present. No diarrhea today. No HA, fever, neck pain, sore throat, rash, back pain, CP, SOB, emesis, dysuria, or extremity pain, edema, weakness, numbness, or tingling. Allergic to clonidine derivatives.  Nothing improves his pain  PCP: Dr. Creta Levin at [facility]  Past Medical History  Diagnosis Date  . Hypertension   . Renal disorder     Past Surgical History  Procedure Date  . Kidney surgery     History reviewed. No pertinent family history.  History  Substance Use Topics  . Smoking status: Never Smoker   . Smokeless tobacco: Not on file  . Alcohol Use: No      Review of Systems 10 Systems reviewed and all are negative for acute change except as noted in the HPI.   Allergies  Clonidine derivatives  Home Medications   Current Outpatient Rx  Name Route Sig Dispense Refill  . ALLOPURINOL 100 MG PO TABS Oral Take 200 mg by mouth every morning.    Marland Kitchen AMOXICILLIN-POT CLAVULANATE 875-125 MG PO TABS Oral Take 1 tablet by mouth every 12 (twelve) hours. 14 tablet 0  . HYDRALAZINE HCL 50 MG PO TABS Oral Take 50 mg by mouth 2 (two) times daily.    Marland Kitchen HYDROCODONE-ACETAMINOPHEN 5-325 MG PO TABS Oral Take 1-2 tablets by mouth every 6 (six) hours as needed for pain. 10 tablet 0  . PANTOPRAZOLE SODIUM 40 MG PO TBEC Oral Take 40 mg by mouth  every morning.      BP 156/84  Pulse 78  Temp 97.9 F (36.6 C) (Oral)  Resp 24  Ht 5\' 11"  (1.803 m)  Wt 172 lb (78.019 kg)  BMI 23.99 kg/m2  SpO2 98%  Physical Exam CONSTITUTIONAL: Well developed/well nourished HEAD AND FACE: Normocephalic/atraumatic EYES: EOMI/PERRL ENMT: Mucous membranes moist NECK: supple no meningeal signs SPINE:entire spine nontender CV: S1/S2 noted, no murmurs/rubs/gallops noted LUNGS: Lungs are clear to auscultation bilaterally, no apparent distress ABDOMEN: soft, no rebound or guarding; LLQ pain, tenderness is moderate.   GU:no cva tenderness NEURO: Pt is awake/alert, moves all extremitiesx4 EXTREMITIES: pulses normal, full ROM SKIN: warm, color normal PSYCH: no abnormalities of mood noted   ED Course  Procedures   DIAGNOSTIC STUDIES: Oxygen Saturation is 99% on room air, normal by my interpretation.    COORDINATION OF CARE:  6:57PM - EDMD will order pain meds, abx, UA, and blood w/u for the pt. EDMD recommends pt for  Admittance; pt agrees with EDMD Pt still with recurrent pain, reporting nausea, but not toxic appearing.  Given h/o renal failure, reports can't tolerate PO meds will admit for IV pain meds and IV antibiotics.   D/w dr Onalee Hua, triad will admit Further imaging not warranted at this time   Labs Reviewed  BASIC METABOLIC PANEL -  Abnormal; Notable for the following:    Sodium 134 (*)     Glucose, Bld 117 (*)     BUN 44 (*)     Creatinine, Ser 3.02 (*)     Calcium 11.2 (*)     GFR calc non Af Amer 20 (*)     GFR calc Af Amer 23 (*)     All other components within normal limits  CBC  DIFFERENTIAL  LACTIC ACID, PLASMA   Ct Abdomen Pelvis Wo Contrast  07/27/2011  *RADIOLOGY REPORT*  Clinical Data: Left-sided abdominal pain for 3 days.  CT ABDOMEN AND PELVIS WITHOUT CONTRAST  Technique:  Multidetector CT imaging of the abdomen and pelvis was performed following the standard protocol without intravenous contrast.  Comparison:  11/21/2009  Findings: There is focal diverticulitis at the junction of the descending and sigmoid portions of the colon with pericolonic inflammation but no abscess or perforation.  The patient has extensive diverticula in the descending and sigmoid portions of the colon.  The liver, spleen, pancreas, adrenal glands, and kidneys demonstrate no acute abnormalities.  There is deformity of the right kidney with a cyst on the upper pole.  The appearance is unchanged since the prior exam.  Appendix is normal.  There is a right inguinal hernia containing only fat.  IMPRESSION: Focal acute diverticulitis of the distal descending colon.  No perforation or abscess.  Extensive diverticulosis of the left side of the colon.  Original Report Authenticated By: Gwynn Burly, M.D.      MDM  Nursing notes including past medical history and social history reviewed and considered in documentation All labs/vitals reviewed and considered Previous records reviewed and considered - recent CT showed diverticulitis without perforation   I personally performed the services described in this documentation, which was scribed in my presence. The recorded information has been reviewed and considered.        Joya Gaskins, MD 07/28/11 2012

## 2011-07-28 NOTE — ED Notes (Signed)
C/o nausea - states present all day.

## 2011-07-28 NOTE — H&P (Signed)
PCP:   STALLINGS,SHEILA, MD   Chief Complaint:  Abdominal pain  HPI: 66 year old male who has chronic kidney disease and hypertension comes in with 2 days of left lower quadrant abdominal pain. He was seen yesterday in the emergency department and was diagnosed with diverticulitis sent home with Augmentin and Lortab. He is taking 2 doses of the Augmentin and one dose of Lortab and still had persistent pain so came back to the emergency department. He is not having any nausea vomiting and is tolerating by mouth. He is having diarrhea that is nonbloody. He denies any fevers at home. He had been asked to admit the patient for persistent abdominal pain.  Review of Systems:  Otherwise negative  Past Medical History: Past Medical History  Diagnosis Date  . Hypertension   . Renal disorder    Past Surgical History  Procedure Date  . Kidney surgery     Medications: Prior to Admission medications   Medication Sig Start Date End Date Taking? Authorizing Provider  allopurinol (ZYLOPRIM) 100 MG tablet Take 200 mg by mouth every morning.    Historical Provider, MD  amoxicillin-clavulanate (AUGMENTIN) 875-125 MG per tablet Take 1 tablet by mouth every 12 (twelve) hours. 07/27/11 08/06/11  [name], MD  hydrALAZINE (APRESOLINE) 50 MG tablet Take 50 mg by mouth 2 (two) times daily.    Historical Provider, MD  HYDROcodone-acetaminophen (NORCO) 5-325 MG per tablet Take 1-2 tablets by mouth every 6 (six) hours as needed for pain. 07/27/11 08/06/11  [name], MD  pantoprazole (PROTONIX) 40 MG tablet Take 40 mg by mouth every morning.    Historical Provider, MD    Allergies:   Allergies  Allergen Reactions  . Clonidine Derivatives Other (See Comments)    Hallucinations, overall feels "really bad"    Social History:  reports that he has never smoked. He does not have any smokeless tobacco history on file. He reports that he does not drink alcohol or use illicit drugs.  Family  History: History reviewed. No pertinent family history.  Physical Exam: Filed Vitals:   07/28/11 1632 07/28/11 1917  BP: 188/84 156/84  Pulse: 87 78  Temp: 97.6 F (36.4 C) 97.9 F (36.6 C)  TempSrc: Oral Oral  Resp: 18 24  Height: 5\' 11"  (1.803 m)   Weight: 78.019 kg (172 lb)   SpO2: 99% 98%   General appearance: alert, cooperative and no distress Neck: no JVD and supple, symmetrical, trachea midline Lungs: clear to auscultation bilaterally Heart: regular rate and rhythm, S1, S2 normal, no murmur, click, rub or gallop Abdomen: soft, non-tender; bowel sounds normal; no masses,  no organomegaly Extremities: extremities normal, atraumatic, no cyanosis or edema Pulses: 2+ and symmetric Skin: Skin color, texture, turgor normal. No rashes or lesions Neurologic: Grossly normal    Labs on Admission:   [facility] 07/28/11 1903 07/27/11 1044  NA 134* 139  K 4.0 3.8  CL 96 99  CO2 23 23  GLUCOSE 117* 111*  BUN 44* 47*  CREATININE 3.02* 3.14*  CALCIUM 11.2* 10.7*  MG -- --  PHOS -- --    Basename 07/27/11 1044  AST 18  ALT 14  ALKPHOS 88  BILITOT 0.7  PROT 8.7*  ALBUMIN 4.5    Basename 07/27/11 1044  LIPASE 36  AMYLASE --    Basename 07/28/11 1903 07/27/11 1044  WBC 6.4 8.4  NEUTROABS 4.3 5.9  HGB 14.0 13.7  HCT 39.8 39.4  MCV 89.2 90.4  PLT 190 192  Radiological Exams on Admission: Ct Abdomen Pelvis Wo Contrast  07/27/2011  *RADIOLOGY REPORT*  Clinical Data: Left-sided abdominal pain for 3 days.  CT ABDOMEN AND PELVIS WITHOUT CONTRAST  Technique:  Multidetector CT imaging of the abdomen and pelvis was performed following the standard protocol without intravenous contrast.  Comparison: 11/21/2009  Findings: There is focal diverticulitis at the junction of the descending and sigmoid portions of the colon with pericolonic inflammation but no abscess or perforation.  The patient has extensive diverticula in the descending and sigmoid portions of the colon.  The  liver, spleen, pancreas, adrenal glands, and kidneys demonstrate no acute abnormalities.  There is deformity of the right kidney with a cyst on the upper pole.  The appearance is unchanged since the prior exam.  Appendix is normal.  There is a right inguinal hernia containing only fat.  IMPRESSION: Focal acute diverticulitis of the distal descending colon.  No perforation or abscess.  Extensive diverticulosis of the left side of the colon.  Original Report Authenticated By: Gwynn Burly, M.D.    Assessment/Plan Present on Admission:  66 year old male with acute diverticulitis  .Abdominal pain, acute, left lower quadrant .Diverticulitis of colon .CKD (chronic kidney disease) .Hypertension  For his acute diverticulitis and the stitches pain medicine to Percocet with some IV morphine for severe breakthrough pain. Place him on ciprofloxacin and Flagyl. His abdominal exam is benign. Also of note he is under the care of the renal doctor in [place] who has told him that he would never be a candidate for dialysis in the future which is unclear to me. He's also previously been under the care of Dr. Mckinley Jewel who was in the process of getting him access to start dialysis in the future. Since he is getting conflicting recommendations from different nephrologist and he does wish to do dialysis in the future if needed I recommended for him to start seeing Dr Mckinley Jewel again. His creatinine right now is at his baseline of 3 and he is currently not uremic. We'll provide him with some gentle IV hydration overnight.  Kimmie Berggren A [phone] 07/28/2011, 8:08 PM

## 2011-07-29 DIAGNOSIS — R109 Unspecified abdominal pain: Secondary | ICD-10-CM

## 2011-07-29 DIAGNOSIS — I10 Essential (primary) hypertension: Secondary | ICD-10-CM

## 2011-07-29 DIAGNOSIS — N189 Chronic kidney disease, unspecified: Secondary | ICD-10-CM

## 2011-07-29 DIAGNOSIS — K5732 Diverticulitis of large intestine without perforation or abscess without bleeding: Secondary | ICD-10-CM

## 2011-07-29 LAB — BASIC METABOLIC PANEL
CO2: 24 mEq/L (ref 19–32)
Calcium: 10 mg/dL (ref 8.4–10.5)
Creatinine, Ser: 3.21 mg/dL — ABNORMAL HIGH (ref 0.50–1.35)
Glucose, Bld: 106 mg/dL — ABNORMAL HIGH (ref 70–99)

## 2011-07-29 LAB — CBC
HCT: 35.3 % — ABNORMAL LOW (ref 39.0–52.0)
MCH: 31.5 pg (ref 26.0–34.0)
MCV: 90.3 fL (ref 78.0–100.0)
Platelets: 180 10*3/uL (ref 150–400)
RDW: 13.3 % (ref 11.5–15.5)

## 2011-07-29 MED ORDER — ACETAMINOPHEN 325 MG PO TABS
650.0000 mg | ORAL_TABLET | Freq: Four times a day (QID) | ORAL | Status: DC | PRN
  Administered 2011-07-29 (×2): 650 mg via ORAL
  Filled 2011-07-29 (×2): qty 2

## 2011-07-29 MED ORDER — ONDANSETRON HCL 4 MG/2ML IJ SOLN
4.0000 mg | INTRAMUSCULAR | Status: DC | PRN
  Administered 2011-07-29: 4 mg via INTRAVENOUS
  Filled 2011-07-29: qty 2

## 2011-07-29 MED ORDER — SODIUM CHLORIDE 0.9 % IJ SOLN
INTRAMUSCULAR | Status: AC
  Filled 2011-07-29: qty 3

## 2011-07-29 MED ORDER — PROMETHAZINE HCL 25 MG/ML IJ SOLN
12.5000 mg | Freq: Four times a day (QID) | INTRAMUSCULAR | Status: DC | PRN
  Administered 2011-07-29 (×2): 12.5 mg via INTRAVENOUS
  Filled 2011-07-29 (×2): qty 1

## 2011-07-29 MED ORDER — CIPROFLOXACIN IN D5W 400 MG/200ML IV SOLN
400.0000 mg | INTRAVENOUS | Status: DC
  Administered 2011-07-29 (×2): 400 mg via INTRAVENOUS
  Filled 2011-07-29: qty 200

## 2011-07-29 NOTE — Progress Notes (Signed)
Notified Dr. Randol Kern notified due to pts daughters concerns of pt becoming nauseated due to the percocet.  She asked if the med could be changed to Tylenol and plus the phenergan worked better for him in the past.  New orders given and followed.

## 2011-07-29 NOTE — Consult Note (Signed)
ANTIBIOTIC CONSULT NOTE - INITIAL  Pharmacy Consult for Cipro Indication: possible abdominal infection, diverticulitis  Allergies  Allergen Reactions  . Clonidine Derivatives Other (See Comments)    Hallucinations, overall feels "really bad"   Patient Measurements: Height: 5\' 11"  (180.3 cm) Weight: 172 lb 8 oz (78.245 kg) IBW/kg (Calculated) : 75.3   Vital Signs: Temp: 97.5 F (36.4 C) (06/23 0500) Temp src: Oral (06/23 0500) BP: 145/67 mmHg (06/23 0500) Pulse Rate: 62  (06/23 0500) Intake/Output from previous day: 06/22 0701 - 06/23 0700 In: 781.3 [I.V.:481.3; IV Piggyback:300] Out: 500 [Urine:500] Intake/Output from this shift:    Labs:  Basename 07/29/11 0451 07/28/11 1903 07/27/11 1044  WBC 6.1 6.4 8.4  HGB 12.3* 14.0 13.7  PLT 180 190 192  LABCREA -- -- --  CREATININE 3.21* 3.02* 3.14*   Estimated Creatinine Clearance: 24.1 ml/min (by C-G formula based on Cr of 3.21). No results found for this basename: VANCOTROUGH:2,VANCOPEAK:2,VANCORANDOM:2,GENTTROUGH:2,GENTPEAK:2,GENTRANDOM:2,TOBRATROUGH:2,TOBRAPEAK:2,TOBRARND:2,AMIKACINPEAK:2,AMIKACINTROU:2,AMIKACIN:2, in the last 72 hours   Microbiology: No results found for this or any previous visit (from the past 720 hour(s)).  Medical History: Past Medical History  Diagnosis Date  . Hypertension   . Renal disorder   . Gout     occasional   Medications:  Scheduled:    . sodium chloride   Intravenous Once  . allopurinol  200 mg Oral q morning - 10a  . ampicillin-sulbactam (UNASYN) IV  3 g Intravenous Once  . ciprofloxacin  400 mg Intravenous Q24H  . hydrALAZINE  50 mg Oral BID  . metronidazole  500 mg Intravenous Q8H  .  morphine injection  4 mg Intravenous Once  . ondansetron  4 mg Intravenous Once  . pantoprazole  40 mg Oral q morning - 10a  . DISCONTD: ciprofloxacin  400 mg Intravenous Q12H  . DISCONTD: ciprofloxacin  400 mg Intravenous Once   Assessment: 67yo male with chronic kidney dz, c/o abdominal  pain Estimated Creatinine Clearance: 24.1 ml/min (by C-G formula based on Cr of 3.21).  Goal of Therapy:  Eradicate infection.  Plan: Cipro 400mg  iv q24hrs Labs per protocol  Valrie Hart A 07/29/2011,9:13 AM

## 2011-07-29 NOTE — Progress Notes (Signed)
Notified Dr. Nobie Putnam about pts continuing c/o nausea.  Recommended to him to change the Zofran to Q4hrs instead of 6.  New orders put in.

## 2011-07-29 NOTE — Progress Notes (Signed)
Subjective: This plan was admitted yesterday with sigmoid diverticulitis. He says he is in some pain. He has not opened his bowels.           Physical Exam: Blood pressure 145/67, pulse 62, temperature 97.5 F (36.4 C), temperature source Oral, resp. rate 20, height 5\' 11"  (1.803 m), weight 78.245 kg (172 lb 8 oz), SpO2 97.00%. He looks systemically well. Is not toxic or septic. His abdomen is soft and tender in a generalized fashion, with more tenderness in the left low quadrant. His abdomen is not rigid. Bowel sounds are heard and clinically he does not have an acute abdomen. Lung fields are clear. He is alert and orientated.        Basic Metabolic Panel:  Basename 07/29/11 0451 07/28/11 1903  NA 135 134*  K 4.2 4.0  CL 99 96  CO2 24 23  GLUCOSE 106* 117*  BUN 44* 44*  CREATININE 3.21* 3.02*  CALCIUM 10.0 11.2*  MG -- --  PHOS -- --   Liver Function Tests:  Basename 07/27/11 1044  AST 18  ALT 14  ALKPHOS 88  BILITOT 0.7  PROT 8.7*  ALBUMIN 4.5     CBC:  Basename 07/29/11 0451 07/28/11 1903 07/27/11 1044  WBC 6.1 6.4 --  NEUTROABS -- 4.3 5.9  HGB 12.3* 14.0 --  HCT 35.3* 39.8 --  MCV 90.3 89.2 --  PLT 180 190 --    Ct Abdomen Pelvis Wo Contrast  07/27/2011  *RADIOLOGY REPORT*  Clinical Data: Left-sided abdominal pain for 3 days.  CT ABDOMEN AND PELVIS WITHOUT CONTRAST  Technique:  Multidetector CT imaging of the abdomen and pelvis was performed following the standard protocol without intravenous contrast.  Comparison: 11/21/2009  Findings: There is focal diverticulitis at the junction of the descending and sigmoid portions of the colon with pericolonic inflammation but no abscess or perforation.  The patient has extensive diverticula in the descending and sigmoid portions of the colon.  The liver, spleen, pancreas, adrenal glands, and kidneys demonstrate no acute abnormalities.  There is deformity of the right kidney with a cyst on the upper pole.  The  appearance is unchanged since the prior exam.  Appendix is normal.  There is a right inguinal hernia containing only fat.  IMPRESSION: Focal acute diverticulitis of the distal descending colon.  No perforation or abscess.  Extensive diverticulosis of the left side of the colon.  Original Report Authenticated By: Gwynn Burly, M.D.      Medications:  Scheduled:   . sodium chloride   Intravenous Once  . allopurinol  200 mg Oral q morning - 10a  . ampicillin-sulbactam (UNASYN) IV  3 g Intravenous Once  . ciprofloxacin  400 mg Intravenous Q24H  . hydrALAZINE  50 mg Oral BID  . metronidazole  500 mg Intravenous Q8H  .  morphine injection  4 mg Intravenous Once  . ondansetron  4 mg Intravenous Once  . pantoprazole  40 mg Oral q morning - 10a  . DISCONTD: ciprofloxacin  400 mg Intravenous Q12H  . DISCONTD: ciprofloxacin  400 mg Intravenous Once    Impression: 1. Sigmoid diverticulitis. 2. Chronic kidney disease. 3. Hypertension.     Plan: 1. Continue with intravenous antibiotics and analgesia as required. 2. Nephrology consultation for his chronic kidney disease. I think he would be a good candidate for dialysis if he wishes to have it and when he is appropriate for it.     LOS: 1 day   [facility]  C Pager [phone]  07/29/2011, 9:32 AM

## 2011-07-30 ENCOUNTER — Encounter (HOSPITAL_COMMUNITY): Payer: Self-pay | Admitting: Internal Medicine

## 2011-07-30 DIAGNOSIS — N184 Chronic kidney disease, stage 4 (severe): Secondary | ICD-10-CM | POA: Diagnosis present

## 2011-07-30 DIAGNOSIS — K5732 Diverticulitis of large intestine without perforation or abscess without bleeding: Secondary | ICD-10-CM

## 2011-07-30 DIAGNOSIS — I10 Essential (primary) hypertension: Secondary | ICD-10-CM

## 2011-07-30 DIAGNOSIS — R109 Unspecified abdominal pain: Secondary | ICD-10-CM

## 2011-07-30 DIAGNOSIS — N189 Chronic kidney disease, unspecified: Secondary | ICD-10-CM

## 2011-07-30 LAB — CBC
Hemoglobin: 11.3 g/dL — ABNORMAL LOW (ref 13.0–17.0)
MCH: 31.1 pg (ref 26.0–34.0)
RBC: 3.63 MIL/uL — ABNORMAL LOW (ref 4.22–5.81)
WBC: 5.4 10*3/uL (ref 4.0–10.5)

## 2011-07-30 LAB — COMPREHENSIVE METABOLIC PANEL
ALT: 10 U/L (ref 0–53)
Alkaline Phosphatase: 62 U/L (ref 39–117)
CO2: 22 mEq/L (ref 19–32)
Calcium: 9.3 mg/dL (ref 8.4–10.5)
Chloride: 106 mEq/L (ref 96–112)
GFR calc Af Amer: 22 mL/min — ABNORMAL LOW (ref >90)
GFR calc non Af Amer: 19 mL/min — ABNORMAL LOW (ref >90)
Glucose, Bld: 91 mg/dL (ref 70–99)
Potassium: 4.4 mEq/L (ref 3.5–5.1)
Sodium: 140 mEq/L (ref 135–145)
Total Bilirubin: 0.5 mg/dL (ref 0.3–1.2)

## 2011-07-30 MED ORDER — PROMETHAZINE HCL 12.5 MG PO TABS
12.5000 mg | ORAL_TABLET | Freq: Four times a day (QID) | ORAL | Status: DC | PRN
Start: 2011-07-30 — End: 2012-05-27

## 2011-07-30 MED ORDER — CIPROFLOXACIN HCL 250 MG PO TABS: 500.0000 mg | ORAL_TABLET | Freq: Every day | ORAL | Status: DC

## 2011-07-30 MED ORDER — SODIUM CHLORIDE 0.9 % IV SOLN: INTRAVENOUS | Status: DC

## 2011-07-30 MED ORDER — ACETAMINOPHEN 325 MG PO TABS: 650.0000 mg | ORAL_TABLET | Freq: Four times a day (QID) | ORAL | Status: DC | PRN

## 2011-07-30 MED ORDER — CIPROFLOXACIN HCL 500 MG PO TABS: 500.0000 mg | ORAL_TABLET | Freq: Every day | ORAL | Status: AC

## 2011-07-30 MED ORDER — METRONIDAZOLE 500 MG PO TABS
500.0000 mg | ORAL_TABLET | Freq: Three times a day (TID) | ORAL | Status: DC
  Administered 2011-07-30: 500 mg via ORAL
  Filled 2011-07-30: qty 1

## 2011-07-30 MED ORDER — METRONIDAZOLE 500 MG PO TABS: 500.0000 mg | ORAL_TABLET | Freq: Three times a day (TID) | ORAL | Status: AC

## 2011-07-30 NOTE — Progress Notes (Signed)
UR Chart Review Completed  

## 2011-07-30 NOTE — Consult Note (Signed)
Reason for Consult: Chronic renal failure Referring Physician: Dr. Elenor Quinones is an 66 y.o. male.  HPI: Her he's the patient was known to me who has history of her chronic renal failure currently stage IV/late stage III presently and came with abdominal pain mainly on the left side some nausea and vomiting. Patient was found to have diverticulitis. Presently he said he's feeling better. He'll have any nausea vomiting abdominal pain is also getting better. He denies any fever chills or sweating.  Past Medical History  Diagnosis Date  . Hypertension   . Renal disorder   . Gout     occasional    Past Surgical History  Procedure Date  . Kidney surgery     History reviewed. No pertinent family history.  Social History:  reports that he has never smoked. He has never used smokeless tobacco. He reports that he does not drink alcohol or use illicit drugs.  Allergies:  Allergies  Allergen Reactions  . Clonidine Derivatives Other (See Comments)    Hallucinations, anxious,insomnia    Medications: I have reviewed the patient's current medications.  Results for orders placed during the hospital encounter of 07/28/11 (from the past 48 hour(s))  CBC     Status: Normal   Collection Time   07/28/11  7:03 PM      Component Value Range Comment   WBC 6.4  4.0 - 10.5 K/uL    RBC 4.46  4.22 - 5.81 MIL/uL    Hemoglobin 14.0  13.0 - 17.0 g/dL    HCT 62.1  30.8 - 65.7 %    MCV 89.2  78.0 - 100.0 fL    MCH 31.4  26.0 - 34.0 pg    MCHC 35.2  30.0 - 36.0 g/dL    RDW 84.6  96.2 - 95.2 %    Platelets 190  150 - 400 K/uL   DIFFERENTIAL     Status: Normal   Collection Time   07/28/11  7:03 PM      Component Value Range Comment   Neutrophils Relative 67  43 - 77 %    Neutro Abs 4.3  1.7 - 7.7 K/uL    Lymphocytes Relative 16  12 - 46 %    Lymphs Abs 1.0  0.7 - 4.0 K/uL    Monocytes Relative 10  3 - 12 %    Monocytes Absolute 0.7  0.1 - 1.0 K/uL    Eosinophils Relative 5  0 - 5  %    Eosinophils Absolute 0.3  0.0 - 0.7 K/uL    Basophils Relative 1  0 - 1 %    Basophils Absolute 0.1  0.0 - 0.1 K/uL   BASIC METABOLIC PANEL     Status: Abnormal   Collection Time   07/28/11  7:03 PM      Component Value Range Comment   Sodium 134 (*) 135 - 145 mEq/L    Potassium 4.0  3.5 - 5.1 mEq/L    Chloride 96  96 - 112 mEq/L    CO2 23  19 - 32 mEq/L    Glucose, Bld 117 (*) 70 - 99 mg/dL    BUN 44 (*) 6 - 23 mg/dL    Creatinine, Ser 8.41 (*) 0.50 - 1.35 mg/dL    Calcium 32.4 (*) 8.4 - 10.5 mg/dL    GFR calc non Af Amer 20 (*) >90 mL/min    GFR calc Af Amer 23 (*) >90 mL/min   LACTIC ACID,  PLASMA     Status: Normal   Collection Time   07/28/11  7:04 PM      Component Value Range Comment   Lactic Acid, Venous 0.9  0.5 - 2.2 mmol/L   BASIC METABOLIC PANEL     Status: Abnormal   Collection Time   07/29/11  4:51 AM      Component Value Range Comment   Sodium 135  135 - 145 mEq/L    Potassium 4.2  3.5 - 5.1 mEq/L    Chloride 99  96 - 112 mEq/L    CO2 24  19 - 32 mEq/L    Glucose, Bld 106 (*) 70 - 99 mg/dL    BUN 44 (*) 6 - 23 mg/dL    Creatinine, Ser 4.54 (*) 0.50 - 1.35 mg/dL    Calcium 09.8  8.4 - 10.5 mg/dL    GFR calc non Af Amer 19 (*) >90 mL/min    GFR calc Af Amer 22 (*) >90 mL/min   CBC     Status: Abnormal   Collection Time   07/29/11  4:51 AM      Component Value Range Comment   WBC 6.1  4.0 - 10.5 K/uL    RBC 3.91 (*) 4.22 - 5.81 MIL/uL    Hemoglobin 12.3 (*) 13.0 - 17.0 g/dL    HCT 11.9 (*) 14.7 - 52.0 %    MCV 90.3  78.0 - 100.0 fL    MCH 31.5  26.0 - 34.0 pg    MCHC 34.8  30.0 - 36.0 g/dL    RDW 82.9  56.2 - 13.0 %    Platelets 180  150 - 400 K/uL   CBC     Status: Abnormal   Collection Time   07/30/11  4:27 AM      Component Value Range Comment   WBC 5.4  4.0 - 10.5 K/uL    RBC 3.63 (*) 4.22 - 5.81 MIL/uL    Hemoglobin 11.3 (*) 13.0 - 17.0 g/dL    HCT 86.5 (*) 78.4 - 52.0 %    MCV 90.9  78.0 - 100.0 fL    MCH 31.1  26.0 - 34.0 pg    MCHC 34.2   30.0 - 36.0 g/dL    RDW 69.6  29.5 - 28.4 %    Platelets 162  150 - 400 K/uL   COMPREHENSIVE METABOLIC PANEL     Status: Abnormal   Collection Time   07/30/11  4:27 AM      Component Value Range Comment   Sodium 140  135 - 145 mEq/L    Potassium 4.4  3.5 - 5.1 mEq/L    Chloride 106  96 - 112 mEq/L    CO2 22  19 - 32 mEq/L    Glucose, Bld 91  70 - 99 mg/dL    BUN 35 (*) 6 - 23 mg/dL    Creatinine, Ser 1.32 (*) 0.50 - 1.35 mg/dL    Calcium 9.3  8.4 - 44.0 mg/dL    Total Protein 6.5  6.0 - 8.3 g/dL    Albumin 3.4 (*) 3.5 - 5.2 g/dL    AST 15  0 - 37 U/L    ALT 10  0 - 53 U/L    Alkaline Phosphatase 62  39 - 117 U/L    Total Bilirubin 0.5  0.3 - 1.2 mg/dL    GFR calc non Af Amer 19 (*) >90 mL/min    GFR calc Af Amer 22 (*) >  90 mL/min     No results found.  Review of Systems  Constitutional: Negative for fever.  Respiratory: Negative for shortness of breath.   Cardiovascular: Negative for chest pain, claudication and PND.  Gastrointestinal: Positive for nausea, vomiting and abdominal pain. Negative for melena.       Abdominal pain is mainly on the left side with no radiation. He denies any fever chills or sweating.  Neurological: Positive for weakness.   Blood pressure 137/76, pulse 66, temperature 97.9 F (36.6 C), temperature source Oral, resp. rate 20, height 5\' 11"  (1.803 m), weight 78.245 kg (172 lb 8 oz), SpO2 98.00%. Physical Exam  Eyes: No scleral icterus.  Neck: No JVD present.  Cardiovascular: Normal rate, regular rhythm and normal heart sounds.   No murmur heard. Respiratory: No respiratory distress. He has no wheezes.  GI: Soft. He exhibits no distension. There is tenderness. There is rebound.  Musculoskeletal: He exhibits no edema.    Assessment/Plan: Problem #1 renal failure possibly acute on chronic his BUN and creatinine is slightly higher than his baseline. Presently patient is none oliguric. Patient had previous workup including ultrasound and also MRA. His  ultrasound from 8/06 showed right kidney to be 10.4 and left kidney to be 12.3 with bilateral cortical atrophy. Since patient had an equal kidney he had MRA with no ostial stenosis bilaterally. 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 Problem #2 hypertension his blood pressure seems to be reasonably controlled Problem #3 history of diabetes Problem #4 history of GERD Problem #5 history of hyperlipidemia Problem #6 history of gout Problem #7 history of hiatal hernia Problem #8 history of her abdominal pain most likely from his diverticulitis. Problem #9 hypercalcemia most likely from dehydration his calcium level has corrected. Plan: We'll continue his present management We'll check his phosphorus We'll encourage patient to increase his by mouth intake is not limited to hydrate him. How discussed with him about access placement once patient is discharged from the hospital.    [facility] S 07/30/2011, 10:27 AM

## 2011-07-30 NOTE — Discharge Summary (Signed)
Physician Discharge Summary  Patient ID: Charles Zuniga MRN: [id number] DOB/AGE: 67-12-1945 67 y.o.  Admit date: 07/28/2011 Discharge date: 07/30/2011  Primary Care Physician:  Warrick Parisian, MD Nephrologist: Dr. Kristian Covey  Discharge Diagnoses:    Principal Problem:  *Abdominal pain, acute, left lower quadrant Active Problems:  Diverticulitis of colon  Hypertension  CKD (chronic kidney disease) stage 4, GFR 15-29 ml/min    Medication List  As of 07/30/2011 12:10 PM   STOP taking these medications         amoxicillin-clavulanate 875-125 MG per tablet      Cinnamon 500 MG capsule      furosemide 40 MG tablet      losartan 100 MG tablet         TAKE these medications         acetaminophen 325 MG tablet   Commonly known as: TYLENOL   Take 2 tablets (650 mg total) by mouth every 6 (six) hours as needed.      allopurinol 100 MG tablet   Commonly known as: ZYLOPRIM   Take 200 mg by mouth every morning.      amLODipine 10 MG tablet   Commonly known as: NORVASC   Take 10 mg by mouth daily.      ciprofloxacin 500 MG tablet   Commonly known as: CIPRO   Take 1 tablet (500 mg total) by mouth daily with breakfast.      citalopram 10 MG tablet   Commonly known as: CELEXA   Take 10 mg by mouth every morning.      clonazePAM 0.5 MG tablet   Commonly known as: KLONOPIN   Take 0.5 mg by mouth 2 (two) times daily as needed. For anxiety      hydrALAZINE 50 MG tablet   Commonly known as: APRESOLINE   Take 50 mg by mouth every morning. May also Take 1 tablet in the evening if needed      HYDROcodone-acetaminophen 5-325 MG per tablet   Commonly known as: NORCO   Take 1-2 tablets by mouth every 6 (six) hours as needed.      metroNIDAZOLE 500 MG tablet   Commonly known as: FLAGYL   Take 1 tablet (500 mg total) by mouth every 8 (eight) hours.      pantoprazole 40 MG tablet   Commonly known as: PROTONIX   Take 40 mg by mouth every morning.      promethazine 12.5 MG  tablet   Commonly known as: PHENERGAN   Take 1 tablet (12.5 mg total) by mouth every 6 (six) hours as needed for nausea.           Discharge Exam: Blood pressure 137/76, pulse 66, temperature 97.9 F (36.6 C), temperature source Oral, resp. rate 20, height 5\' 11"  (1.803 m), weight 78.245 kg (172 lb 8 oz), SpO2 98.00%. NAD CTA B S1, S2, RRR Soft, NT, BS+ No edema b/l  Disposition and Follow-up:  Follow up with primary doctor in 2 weeks, follow up with nephrologist as scheduled  Consults:  Dr. Kristian Covey, Nephrology   Significant Diagnostic Studies:  No results found.  Brief H and P: For complete details please refer to admission H and P, but in brief 66 year old male who has chronic kidney disease and hypertension comes in with 2 days of left lower quadrant abdominal pain. He was seen yesterday in the emergency department and was diagnosed with diverticulitis sent home with Augmentin and Lortab. He is taking 2 doses of the Augmentin and  one dose of Lortab and still had persistent pain so came back to the emergency department. He is not having any nausea vomiting and is tolerating by mouth. He is having diarrhea that is nonbloody. He denies any fevers at home. He had been asked to admit the patient for persistent abdominal pain.   Hospital Course:  This gentleman was admitted to the hospital with acute diverticulitis. He was started on IV antibiotics. Since his admission his abdominal pain, nausea and vomiting have improved. He is currently still on a full liquid diet. We will advance to a low-residue diet. If the patient does tolerate this, then his antibiotics will be changed to by mouth and patient can be discharged home. He's not had any further fever, a normal WBC count. He feels clinically improved. The patient does have chronic kidney disease and is followed by Dr. Kristian Covey. Nephrology consultation was provided as an inpatient by Dr. Kristian Covey. It was felt that patient is not at the  point to start dialysis yet. This will be further discussed in the outpatient setting.  Time spent on Discharge:  Signed: Vivek Grealish [facility] Pager: [phone] 07/30/2011, 12:10 PM

## 2011-07-30 NOTE — Discharge Instructions (Signed)
Diverticulitis A diverticulum is a small pouch or sac on the colon. Diverticulosis is the presence of these diverticula on the colon. Diverticulitis is the irritation (inflammation) or infection of diverticula. CAUSES  The colon and its diverticula contain bacteria. If food particles block the tiny opening to a diverticulum, the bacteria inside can grow and cause an increase in pressure. This leads to infection and inflammation and is called diverticulitis. SYMPTOMS   Abdominal pain and tenderness. Usually, the pain is located on the left side of your abdomen. However, it could be located elsewhere.   Fever.   Bloating.   Feeling sick to your stomach (nausea).   Throwing up (vomiting).   Abnormal stools.  DIAGNOSIS  Your caregiver will take a history and perform a physical exam. Since many things can cause abdominal pain, other tests may be necessary. Tests may include:  Blood tests.   Urine tests.   X-ray of the abdomen.   CT scan of the abdomen.  Sometimes, surgery is needed to determine if diverticulitis or other conditions are causing your symptoms. TREATMENT  Most of the time, you can be treated without surgery. Treatment includes:  Resting the bowels by only having liquids for a few days. As you improve, you will need to eat a low-fiber diet.   Intravenous (IV) fluids if you are losing body fluids (dehydrated).   Antibiotic medicines that treat infections may be given.   Pain and nausea medicine, if needed.   Surgery if the inflamed diverticulum has burst.  HOME CARE INSTRUCTIONS   Try a clear liquid diet (broth, tea, or water for as long as directed by your caregiver). You may then gradually begin a low-fiber diet as tolerated. A low-fiber diet is a diet with less than 10 grams of fiber. Choose the foods below to reduce fiber in the diet:   White breads, cereals, rice, and pasta.   Cooked fruits and vegetables or soft fresh fruits and vegetables without the skin.     Ground or well-cooked tender beef, ham, veal, lamb, pork, or poultry.   Eggs and seafood.   After your diverticulitis symptoms have improved, your caregiver may put you on a high-fiber diet. A high-fiber diet includes 14 grams of fiber for every 1000 calories consumed. For a standard 2000 calorie diet, you would need 28 grams of fiber. Follow these diet guidelines to help you increase the fiber in your diet. It is important to slowly increase the amount fiber in your diet to avoid gas, constipation, and bloating.   Choose whole-grain breads, cereals, pasta, and brown rice.   Choose fresh fruits and vegetables with the skin on. Do not overcook vegetables because the more vegetables are cooked, the more fiber is lost.   Choose more nuts, seeds, legumes, dried peas, beans, and lentils.   Look for food products that have greater than 3 grams of fiber per serving on the Nutrition Facts label.   Take all medicine as directed by your caregiver.   If your caregiver has given you a follow-up appointment, it is very important that you go. Not going could result in lasting (chronic) or permanent injury, pain, and disability. If there is any problem keeping the appointment, call to reschedule.  SEEK MEDICAL CARE IF:   Your pain does not improve.   You have a hard time advancing your diet beyond clear liquids.   Your bowel movements do not return to normal.  SEEK IMMEDIATE MEDICAL CARE IF:   Your pain becomes   worse.   You have an oral temperature above 102 F (38.9 C), not controlled by medicine.   You have repeated vomiting.   You have bloody or black, tarry stools.   Symptoms that brought you to your caregiver become worse or are not getting better.  MAKE SURE YOU:   Understand these instructions.   Will watch your condition.   Will get help right away if you are not doing well or get worse.  Ciprofloxacin tablets What is this medicine? CIPROFLOXACIN (sip roe FLOX a sin) is a  quinolone antibiotic. It is used to treat certain kinds of bacterial infections. It will not work for colds, flu, or other viral infections. This medicine may be used for other purposes; ask your health care provider or pharmacist if you have questions. What should I tell my health care provider before I take this medicine? They need to know if you have any of these conditions: -heart condition -joint problems -kidney disease -liver disease -myasthenia gravis -seizure disorder -an unusual or allergic reaction to ciprofloxacin, other antibiotics or medicines, foods, dyes, or preservatives -pregnant or trying to get pregnant -breast-feeding How should I use this medicine? Take this medicine by mouth with a glass of water. Follow the directions on the prescription label. Take your medicine at regular intervals. Do not take your medicine more often than directed. Take all of your medicine as directed even if you think your are better. Do not skip doses or stop your medicine early. You can take this medicine with food or on an empty stomach. It can be taken with a meal that contains dairy or calcium, but do not take it alone with a dairy product, like milk or yogurt or calcium-fortified juice. A special MedGuide will be given to you by the pharmacist with each prescription and refill. Be sure to read this information carefully each time. Talk to your pediatrician regarding the use of this medicine in children. Special care may be needed. Overdosage: If you think you have taken too much of this medicine contact a poison control center or emergency room at once. NOTE: This medicine is only for you. Do not share this medicine with others. What if I miss a dose? If you miss a dose, take it as soon as you can. If it is almost time for your next dose, take only that dose. Do not take double or extra doses. What may interact with this medicine? Do not take this medicine with any of the following  medications: -cisapride -droperidol -terfenadine -tizanidine This medicine may also interact with the following medications: -antacids -caffeine -cyclosporin -didanosine (ddI) buffered tablets or powder -medicines for diabetes -medicines for inflammation like ibuprofen, naproxen -methotrexate -multivitamins -omeprazole -phenytoin -probenecid -sucralfate -theophylline -warfarin This list may not describe all possible interactions. Give your health care provider a list of all the medicines, herbs, non-prescription drugs, or dietary supplements you use. Also tell them if you smoke, drink alcohol, or use illegal drugs. Some items may interact with your medicine. What should I watch for while using this medicine? Tell your doctor or health care professional if your symptoms do not improve. Do not treat diarrhea with over the counter products. Contact your doctor if you have diarrhea that lasts more than 2 days or if it is severe and watery. You may get drowsy or dizzy. Do not drive, use machinery, or do anything that needs mental alertness until you know how this medicine affects you. Do not stand or sit up quickly,  especially if you are an older patient. This reduces the risk of dizzy or fainting spells. This medicine can make you more sensitive to the sun. Keep out of the sun. If you cannot avoid being in the sun, wear protective clothing and use sunscreen. Do not use sun lamps or tanning beds/booths. Avoid antacids, aluminum, calcium, iron, magnesium, and zinc products for 6 hours before and 2 hours after taking a dose of this medicine. What side effects may I notice from receiving this medicine? Side effects that you should report to your doctor or health care professional as soon as possible: -allergic reactions like skin rash, itching or hives, swelling of the face, lips, or tongue -breathing problems -confusion, nightmares or hallucinations -feeling faint or lightheaded,  falls -irregular heartbeat -joint, muscle or tendon pain or swelling -pain or trouble passing urine -redness, blistering, peeling or loosening of the skin, including inside the mouth -seizure -unusual pain, numbness, tingling, or weakness Side effects that usually do not require medical attention (report to your doctor or health care professional if they continue or are bothersome): -diarrhea -nausea or stomach upset -white patches or sores in the mouth This list may not describe all possible side effects. Call your doctor for medical advice about side effects. You may report side effects to FDA at 1-800-FDA-1088. Where should I keep my medicine? Keep out of the reach of children. Store at room temperature below 30 degrees C (86 degrees F). Keep container tightly closed. Throw away any unused medicine after the expiration date. NOTE: This sheet is a summary. It may not cover all possible information. If you have questions about this medicine, talk to your doctor, pharmacist, or health care provider.  2012, Elsevier/Gold Standard. (04/11/2009 11:41:11 AM)Document Released: 11/01/2004 Document Revised: 01/11/2011 Document Reviewed: 02/27/2010 [facility] Patient Information 2012 [place], [place].

## 2011-07-30 NOTE — Care Management Note (Signed)
    Page 1 of 1   07/30/2011     1:43:54 PM   CARE MANAGEMENT NOTE 07/30/2011  Patient:  Charles Zuniga, Charles Zuniga   Account Number:  [id number]  Date Initiated:  07/30/2011  Documentation initiated by:  Rosemary Holms  Subjective/Objective Assessment:   Pt admitted with pain/diverticulitis. Lives home alone but his daughter looks after him. She is also POA. He states he is independent, not driving anymore but able to do ADL     Action/Plan:   Pt unable to identify any HH needs.   Anticipated DC Date:  07/30/2011   Anticipated DC Plan:  HOME/SELF CARE      DC Planning Services  CM consult      Choice offered to / List presented to:             Status of service:  Completed, signed off Medicare Important Message given?   (If response is "NO", the following Medicare IM given date fields will be blank) Date Medicare IM given:   Date Additional Medicare IM given:    Discharge Disposition:  HOME/SELF CARE  Per UR Regulation:    If discussed at Long Length of Stay Meetings, dates discussed:    Comments:  07/30/11 1200 Shrihan Putt Leanord Hawking RN BSN CM

## 2011-07-30 NOTE — Progress Notes (Signed)
Pt ate 75% of his lunch.  He had no c/o nausea, vomiting, or abdominal pain.  Will plan to d/c as ordered.

## 2011-07-30 NOTE — Progress Notes (Signed)
Discussed with Dr. Kerry Hough that Dr. Kristian Covey came in to see the patient, and stated to me that the patient was not ready for Dialysis and that he had been knowing the pt for years.  He went on to say that the pts creatinine was at his baseline, and that he would schedule him a f/u appointment after discharged.  Dr. Kerry Hough verbalized understanding, and ordered the pts abx to be changed to po by having the Cipro dosed according to pharmacy recommendation due his renal fx.  Spoke with Valrie Hart RPh and he recommended Cipro 500mg  po one time per day.    Orders received and followed, will continue to monitor.

## 2011-11-06 HISTORY — PX: AV FISTULA PLACEMENT: SHX1204

## 2012-05-02 ENCOUNTER — Ambulatory Visit (HOSPITAL_COMMUNITY)
Admission: RE | Admit: 2012-05-02 | Discharge: 2012-05-02 | Disposition: A | Discharge status: Home / Self Care | Payer: Medicare Other | Source: Ambulatory Visit | Attending: Physician Assistant | Admitting: Physician Assistant

## 2012-05-02 ENCOUNTER — Other Ambulatory Visit (HOSPITAL_COMMUNITY): Payer: Self-pay | Admitting: Physician Assistant

## 2012-05-02 DIAGNOSIS — K625 Hemorrhage of anus and rectum: Secondary | ICD-10-CM | POA: Insufficient documentation

## 2012-05-02 DIAGNOSIS — R109 Unspecified abdominal pain: Secondary | ICD-10-CM

## 2012-05-02 DIAGNOSIS — D131 Benign neoplasm of stomach: Secondary | ICD-10-CM | POA: Insufficient documentation

## 2012-05-02 DIAGNOSIS — N3289 Other specified disorders of bladder: Secondary | ICD-10-CM | POA: Insufficient documentation

## 2012-05-02 DIAGNOSIS — M545 Low back pain: Secondary | ICD-10-CM

## 2012-05-02 DIAGNOSIS — K5730 Diverticulosis of large intestine without perforation or abscess without bleeding: Secondary | ICD-10-CM | POA: Insufficient documentation

## 2012-05-05 ENCOUNTER — Ambulatory Visit (HOSPITAL_COMMUNITY): Payer: Medicare Other

## 2012-05-07 ENCOUNTER — Telehealth (INDEPENDENT_AMBULATORY_CARE_PROVIDER_SITE_OTHER): Payer: Self-pay | Admitting: *Deleted

## 2012-05-07 ENCOUNTER — Encounter (INDEPENDENT_AMBULATORY_CARE_PROVIDER_SITE_OTHER): Payer: Self-pay | Admitting: Internal Medicine

## 2012-05-07 ENCOUNTER — Ambulatory Visit (INDEPENDENT_AMBULATORY_CARE_PROVIDER_SITE_OTHER): Payer: Medicare Other | Admitting: Internal Medicine

## 2012-05-07 ENCOUNTER — Other Ambulatory Visit (INDEPENDENT_AMBULATORY_CARE_PROVIDER_SITE_OTHER): Payer: Self-pay | Admitting: *Deleted

## 2012-05-07 VITALS — BP 166/80 | HR 64 | Ht 69.0 in | Wt 170.3 lb

## 2012-05-07 DIAGNOSIS — R110 Nausea: Secondary | ICD-10-CM

## 2012-05-07 DIAGNOSIS — K625 Hemorrhage of anus and rectum: Secondary | ICD-10-CM

## 2012-05-07 DIAGNOSIS — R112 Nausea with vomiting, unspecified: Secondary | ICD-10-CM | POA: Insufficient documentation

## 2012-05-07 DIAGNOSIS — Z1211 Encounter for screening for malignant neoplasm of colon: Secondary | ICD-10-CM

## 2012-05-07 NOTE — Telephone Encounter (Signed)
Patient needs movi prep 

## 2012-05-07 NOTE — Patient Instructions (Addendum)
EGD/Colonoscopy. The risks and benefits such as perforation, bleeding, and infection were reviewed with the patient and is agreeable. 

## 2012-05-07 NOTE — Progress Notes (Addendum)
Subjective:     Patient ID: Charles Zuniga, male   DOB: 06-22-1945, 67 y.o.   MRN: [id number]  Please be advised that patient did not bring his medication in today. They were in the computer and I reviewed with him and his daughter.  HPI Referred to our office by Dr. Warrick Parisian, [facility]. He tells me he has nausea on occasion. He also has vomiting. He can take Phenergan and his nausea and vomiting will resolve. His acid reflux is controlled with the Protonix. If he misses a dose, he will have acid reflux. There is no dysphagia. He can eat anything and no foods will lodge. He has a BM 4-5 times a day. Says his stools are loose. He has had some rectal bleeding.   He has had loose stools off and on for a couple of months.  04/25/2012 NA 139, K 4.8, Chloride 110, Calcium 9.3, Glucose 92, BUn 33, Creatinine 2.8, Last colonoscopy was greater than 10 yrs ago and was normal by Dr. Karilyn Cota.   05/02/2012 CT abdomen/pelvis with CM: Question 9 mm gastric polyp in the proximal stomach this is contain  with and the moderate-to-large hiatal hernia. In retrospect, this  finding was present on the previous study and is not substantially  changed.  Bilateral renal cortical scarring with central sinus cysts or  obstructed calyces in the upper pole of the right kidney.  Distended bladder raises the question of bladder outlet  obstruction.  Colonic diverticulosis without diverticulitis.  Right inguinal and Petit's hernias.  Hx of CRD and has a shunt rt arm placed in October.  Review of Systems see hpi Current Outpatient Prescriptions  Medication Sig Dispense Refill  . acetaminophen (TYLENOL) 325 MG tablet Take 2 tablets (650 mg total) by mouth every 6 (six) hours as needed.  30 tablet  1  . allopurinol (ZYLOPRIM) 100 MG tablet Take 200 mg by mouth as needed.       Marland Kitchen amLODipine (NORVASC) 10 MG tablet Take 10 mg by mouth daily.      . cholecalciferol (VITAMIN D) 1000  UNITS tablet Take 1,000 Units by mouth daily.      . citalopram (CELEXA) 10 MG tablet Take 10 mg by mouth every morning.      . clonazePAM (KLONOPIN) 0.5 MG tablet Take 0.5 mg by mouth 2 (two) times daily as needed. For anxiety      . hydrALAZINE (APRESOLINE) 50 MG tablet Take 50 mg by mouth every morning. May also Take 1 tablet in the evening if needed      . HYDROcodone-acetaminophen (NORCO) 5-325 MG per tablet Take 1-2 tablets by mouth every 6 (six) hours as needed.      . pantoprazole (PROTONIX) 40 MG tablet Take 40 mg by mouth every morning.      . promethazine (PHENERGAN) 12.5 MG tablet Take 1 tablet (12.5 mg total) by mouth every 6 (six) hours as needed for nausea.  30 tablet  0   No current facility-administered medications for this visit.   Past Medical History  Diagnosis Date  . Hypertension   . Renal disorder   . Gout     occasional  . CKD (chronic kidney disease) stage 4, GFR 15-29 ml/min   . Diverticulitis    Past Surgical History  Procedure Laterality Date  . Kidney surgery     Allergies  Allergen Reactions  . Clonidine Derivatives Other (See Comments)    Hallucinations, anxious,insomnia  Objective:   Physical Exam  Filed Vitals:   05/07/12 0913  BP: 166/80  Pulse: 64  Height: 5\' 9"  (1.753 m)  Weight: 170 lb 4.8 oz (77.248 kg)   Alert and oriented. Skin warm and dry. Oral mucosa is moist.   . Sclera anicteric, conjunctivae is pink. Thyroid not enlarged. No cervical lymphadenopathy. Lungs clear. Heart regular rate and rhythm. Murmur heard.  Abdomen is soft. Bowel sounds are positive. No hepatomegaly. No abdominal masses felt. No tenderness.  No edema to lower extremities.   Stool brown and guaiac negative.    Assessment:     Nausea, PUD needs to be ruled out.  Rectal bleeding: Colonic neoplasm, hemorrhoids needs to be ruled out.  Last colonoscopy greater than 10 yrs.     Plan:     EGD/Colonoscopy.The risks and benefits such as perforation,  bleeding, and infection were reviewed with the patient and is agreeable.

## 2012-05-08 MED ORDER — PEG-KCL-NACL-NASULF-NA ASC-C 100 G PO SOLR: 1.0000 | Freq: Once | ORAL | Status: DC

## 2012-05-13 ENCOUNTER — Encounter (HOSPITAL_COMMUNITY): Payer: Self-pay | Admitting: Pharmacy Technician

## 2012-05-20 ENCOUNTER — Encounter (INDEPENDENT_AMBULATORY_CARE_PROVIDER_SITE_OTHER): Payer: Self-pay

## 2012-05-27 ENCOUNTER — Encounter (HOSPITAL_COMMUNITY)
Admission: RE | Disposition: A | Discharge status: Home / Self Care | Payer: Self-pay | Source: Ambulatory Visit | Attending: Internal Medicine

## 2012-05-27 ENCOUNTER — Ambulatory Visit (HOSPITAL_COMMUNITY)
Admission: RE | Admit: 2012-05-27 | Discharge: 2012-05-27 | Disposition: A | Discharge status: Home / Self Care | Payer: Medicare Other | Source: Ambulatory Visit | Attending: Internal Medicine | Admitting: Internal Medicine

## 2012-05-27 ENCOUNTER — Encounter (HOSPITAL_COMMUNITY): Payer: Self-pay | Admitting: *Deleted

## 2012-05-27 DIAGNOSIS — K5730 Diverticulosis of large intestine without perforation or abscess without bleeding: Secondary | ICD-10-CM | POA: Insufficient documentation

## 2012-05-27 DIAGNOSIS — R110 Nausea: Secondary | ICD-10-CM

## 2012-05-27 DIAGNOSIS — K644 Residual hemorrhoidal skin tags: Secondary | ICD-10-CM | POA: Insufficient documentation

## 2012-05-27 DIAGNOSIS — K625 Hemorrhage of anus and rectum: Secondary | ICD-10-CM

## 2012-05-27 DIAGNOSIS — D131 Benign neoplasm of stomach: Secondary | ICD-10-CM

## 2012-05-27 DIAGNOSIS — K921 Melena: Secondary | ICD-10-CM

## 2012-05-27 DIAGNOSIS — R112 Nausea with vomiting, unspecified: Secondary | ICD-10-CM

## 2012-05-27 DIAGNOSIS — N184 Chronic kidney disease, stage 4 (severe): Secondary | ICD-10-CM | POA: Insufficient documentation

## 2012-05-27 DIAGNOSIS — I129 Hypertensive chronic kidney disease with stage 1 through stage 4 chronic kidney disease, or unspecified chronic kidney disease: Secondary | ICD-10-CM | POA: Insufficient documentation

## 2012-05-27 DIAGNOSIS — E785 Hyperlipidemia, unspecified: Secondary | ICD-10-CM | POA: Insufficient documentation

## 2012-05-27 DIAGNOSIS — Z79899 Other long term (current) drug therapy: Secondary | ICD-10-CM | POA: Insufficient documentation

## 2012-05-27 DIAGNOSIS — K219 Gastro-esophageal reflux disease without esophagitis: Secondary | ICD-10-CM | POA: Insufficient documentation

## 2012-05-27 DIAGNOSIS — D126 Benign neoplasm of colon, unspecified: Secondary | ICD-10-CM | POA: Insufficient documentation

## 2012-05-27 DIAGNOSIS — R933 Abnormal findings on diagnostic imaging of other parts of digestive tract: Secondary | ICD-10-CM

## 2012-05-27 DIAGNOSIS — K449 Diaphragmatic hernia without obstruction or gangrene: Secondary | ICD-10-CM | POA: Insufficient documentation

## 2012-05-27 DIAGNOSIS — R197 Diarrhea, unspecified: Secondary | ICD-10-CM | POA: Insufficient documentation

## 2012-05-27 HISTORY — DX: Gastro-esophageal reflux disease without esophagitis: K21.9

## 2012-05-27 HISTORY — DX: Localized swelling, mass and lump, trunk: R22.2

## 2012-05-27 HISTORY — PX: COLONOSCOPY WITH ESOPHAGOGASTRODUODENOSCOPY (EGD): SHX5779

## 2012-05-27 LAB — CBC
Hemoglobin: 12.1 g/dL — ABNORMAL LOW (ref 13.0–17.0)
MCV: 89.8 fL (ref 78.0–100.0)
Platelets: 143 10*3/uL — ABNORMAL LOW (ref 150–400)
RBC: 4 MIL/uL — ABNORMAL LOW (ref 4.22–5.81)
WBC: 5.9 10*3/uL (ref 4.0–10.5)

## 2012-05-27 SURGERY — COLONOSCOPY WITH ESOPHAGOGASTRODUODENOSCOPY (EGD)
Anesthesia: Moderate Sedation

## 2012-05-27 MED ORDER — EPINEPHRINE HCL 0.1 MG/ML IJ SOLN
INTRAMUSCULAR | Status: AC
  Filled 2012-05-27: qty 10

## 2012-05-27 MED ORDER — MEPERIDINE HCL 50 MG/ML IJ SOLN
INTRAMUSCULAR | Status: AC
  Filled 2012-05-27: qty 1

## 2012-05-27 MED ORDER — STERILE WATER FOR IRRIGATION IR SOLN
Status: DC | PRN
  Administered 2012-05-27: 14:00:00

## 2012-05-27 MED ORDER — METOCLOPRAMIDE HCL 5 MG PO TABS: 5.0000 mg | ORAL_TABLET | Freq: Four times a day (QID) | ORAL | Status: DC

## 2012-05-27 MED ORDER — MEPERIDINE HCL 50 MG/ML IJ SOLN
INTRAMUSCULAR | Status: DC | PRN
  Administered 2012-05-27 (×2): 25 mg via INTRAVENOUS

## 2012-05-27 MED ORDER — BUTAMBEN-TETRACAINE-BENZOCAINE 2-2-14 % EX AERO
INHALATION_SPRAY | CUTANEOUS | Status: DC | PRN
  Administered 2012-05-27: 2 via TOPICAL

## 2012-05-27 MED ORDER — ONDANSETRON 4 MG PO TBDP: 4.0000 mg | ORAL_TABLET | Freq: Two times a day (BID) | ORAL | Status: DC | PRN

## 2012-05-27 MED ORDER — SODIUM CHLORIDE 0.9 % IV SOLN
INTRAVENOUS | Status: DC
  Administered 2012-05-27: 13:00:00 via INTRAVENOUS

## 2012-05-27 MED ORDER — MIDAZOLAM HCL 5 MG/5ML IJ SOLN
INTRAMUSCULAR | Status: AC
  Filled 2012-05-27: qty 10

## 2012-05-27 MED ORDER — MIDAZOLAM HCL 5 MG/5ML IJ SOLN
INTRAMUSCULAR | Status: DC | PRN
  Administered 2012-05-27: 1 mg via INTRAVENOUS
  Administered 2012-05-27: 2 mg via INTRAVENOUS
  Administered 2012-05-27: 1 mg via INTRAVENOUS
  Administered 2012-05-27 (×3): 2 mg via INTRAVENOUS

## 2012-05-27 NOTE — Op Note (Signed)
EGD PROCEDURE REPORT  PATIENT:  Charles Zuniga  MR#:  [id number] Birthdate:  30-Sep-1945, 67 y.o., male Endoscopist:  Dr. Malissa Hippo, MD Referred By:  Dr. Warrick Parisian, MD Procedure Date: 05/27/2012  Procedure:   EGD with snare polypectomy & Colonoscopy.  Indications:  Patient is 67 year old Caucasian male with recurrent nausea and vomiting. He also has intermittent hematochezia and diarrhea. Recent abdominopelvic CT revealed 10 mm polyp in the herniated part of the stomach.            Informed Consent:  The risks, benefits, alternatives & imponderables which include, but are not limited to, bleeding, infection, perforation, drug reaction and potential missed lesion have been reviewed.  The potential for biopsy, lesion removal, esophageal dilation, etc. have also been discussed.  Questions have been answered.  All parties agreeable.  Please see history & physical in medical record for more information.  Medications:  Demerol 50 mg IV Versed 10 mg IV Cetacaine spray topically for oropharyngeal anesthesia  EGD  Description of procedure:  The endoscope was introduced through the mouth and advanced to the second portion of the duodenum without difficulty or limitations. The mucosal surfaces were surveyed very carefully during advancement of the scope and upon withdrawal.  Findings:  Esophagus:  Mucosa of the esophagus was normal. GE junction was unremarkable. Moderate size hiatal hernia noted with large and a small polyp in it. GEJ:  36 cm Hiatus:  41 cm Stomach:  Stomach was empty and distended very well with insufflation. Folds in the proximal stomach are normal. Multiple hyperplastic-appearing polyps are noted in gastric body and fundus. Pyloric channel was patent and angularis was unremarkable.  Duodenum:  Bulbar and post bulbar mucosa was normal.  Therapeutic/Diagnostic Maneuvers Performed:   Both gastric polyps were snared. These are located in the herniated part of the  stomach. The largest of the two was 10 mm and the other one was smaller. Both of these polyps were snared. There was post-polypectomy bleed from the larger polyp which is controlled with Hemoclip application 3 hemoclips were applied with excellent results. One clip malfunction and was removed. Both of these polyps are retrieved and sent to the lab.  COLONOSCOPY Description of procedure:  After a digital rectal exam was performed, that colonoscope was advanced from the anus through the rectum and colon to the area of the cecum, ileocecal valve and appendiceal orifice. The cecum was deeply intubated. These structures were well-seen and photographed for the record. From the level of the cecum and ileocecal valve, the scope was slowly and cautiously withdrawn. The mucosal surfaces were carefully surveyed utilizing scope tip to flexion to facilitate fold flattening as needed. The scope was pulled down into the rectum where a thorough exam including retroflexion was performed.  Findings:   Prep fair. Multiple diverticula throughout the colon. Small cecal polyp ablated via cold biopsy. Normal rectal mucosa. Small hemorrhoids below the dentate line.  Therapeutic/Diagnostic Maneuvers Performed:  See above.  Complications: Post-polypectomy bleed easily controlled with Hemoclip application.  Cecal Withdrawal Time:  6 minutes  Impression:  Moderate size sliding hiatal hernia with two polyps in the herniated part of the stomach. Both of these polyps were snared. Bleeding from polypectomy site controlled with Hemoclip application. Multiple small hyperplastic-appearing polyps at gastric body and fundus. Pancolonic diverticulosis. Small cecal polyp ablated via cold biopsy. Colonoscopy was somewhat limited because of quality of prep.  Recommendations:  Metoclopramide 5 mg by mouth 4 times a day for 2 weeks. CBC today.  Discontinue promethazine and take ondansetron 4 mg twice a day when necessary for  nausea. Patient advised not to have MRI and he has passed hemoclips. I will contact patient with results of biopsy and blood work.  Shayonna Ocampo U  05/27/2012 3:34 PM  CC: Dr. Warrick Parisian, MD & Dr. Bonnetta Barry ref. provider found

## 2012-05-27 NOTE — H&P (Signed)
Charles Zuniga is an 67 y.o. male.   Chief Complaint: Patient is here for EGD and colonoscopy. HPI: Patient is 67 year old Caucasian male with multiple medical problems who presents with recurrent nausea and vomiting. He has at least 4-5 episodes each week. These episodes occur postprandially. She vomits food and fluid but no hematemesis. Heartburn is well controlled with therapy. His appetite is fair. He states he has lost about 20 pounds in the last 6 months. One of his physicians told him that his nausea vomiting may be secondary to his CAD. He believes he would be on dialysis in the future. He had abdominopelvic CT 2 weeks ago which suggested moderate to large hiatal hernia and gastric polyp in the herniated part of the stomach. He also complains of intermittent diarrhea and hematochezia.  Past Medical History  Diagnosis Date  . Hypertension   . Renal disorder   . Gout     occasional  . CKD (chronic kidney disease) stage 4, GFR 15-29 ml/min   . Diverticulitis   . Gout   . Mass on back   . GERD (gastroesophageal reflux disease)     Past Surgical History  Procedure Laterality Date  . Kidney surgery    . Av fistula placement Left     History reviewed. No pertinent family history. Social History:  reports that he has never smoked. He has never used smokeless tobacco. He reports that he does not drink alcohol or use illicit drugs.  Allergies:  Allergies  Allergen Reactions  . Clonidine Derivatives Other (See Comments)    Hallucinations, anxious,insomnia    Medications Prior to Admission  Medication Sig Dispense Refill  . acetaminophen (TYLENOL) 325 MG tablet Take 2 tablets (650 mg total) by mouth every 6 (six) hours as needed.  30 tablet  1  . allopurinol (ZYLOPRIM) 100 MG tablet Take 200 mg by mouth as needed.       Marland Kitchen amLODipine (NORVASC) 10 MG tablet Take 10 mg by mouth daily.      Marland Kitchen atorvastatin (LIPITOR) 40 MG tablet Take 1 tablet by mouth daily.      . cholecalciferol  (VITAMIN D) 1000 UNITS tablet Take 1,000 Units by mouth daily.      . citalopram (CELEXA) 10 MG tablet Take 10 mg by mouth every morning.      . clonazePAM (KLONOPIN) 0.5 MG tablet Take 0.5 mg by mouth 2 (two) times daily as needed. For anxiety      . hydrALAZINE (APRESOLINE) 50 MG tablet Take 50 mg by mouth every morning. May also Take 1 tablet in the evening if needed      . HYDROcodone-acetaminophen (NORCO) 5-325 MG per tablet Take 1-2 tablets by mouth every 6 (six) hours as needed.      Marland Kitchen losartan (COZAAR) 100 MG tablet Take 1 tablet by mouth daily.      . metoprolol succinate (TOPROL-XL) 25 MG 24 hr tablet Take 1 tablet by mouth daily.      . pantoprazole (PROTONIX) 40 MG tablet Take 40 mg by mouth every morning.      . peg 3350 powder (MOVIPREP) 100 G SOLR Take 1 kit (100 g total) by mouth once.  1 kit  0  . promethazine (PHENERGAN) 12.5 MG tablet Take 1 tablet (12.5 mg total) by mouth every 6 (six) hours as needed for nausea.  30 tablet  0  . spironolactone (ALDACTONE) 25 MG tablet Take 25 mg by mouth 2 (two) times daily.  No results found for this or any previous visit (from the past 48 hour(s)). No results found.  ROS  Blood pressure 201/89, pulse 66, temperature 98.1 F (36.7 C), temperature source Oral, resp. rate 23, height 5\' 9"  (1.753 m), weight 170 lb (77.111 kg), SpO2 100.00%. Physical Exam  Constitutional: He appears well-developed and well-nourished.  HENT:  Mouth/Throat: Oropharynx is clear and moist.  Eyes: Conjunctivae are normal. No scleral icterus.  Neck: No thyromegaly present.  Cardiovascular: Normal rate, regular rhythm and normal heart sounds.   Murmur: grade 3/6 systolic ejection murmur heard all over the precordium. Respiratory: Effort normal and breath sounds normal.  GI: Soft. He exhibits no distension and no mass. There is no tenderness.  Musculoskeletal: Edema:  trace edema around ankles left greater than right.  Lymphadenopathy:    He has no  cervical adenopathy.  Neurological: He is alert.  Skin: Skin is warm and dry.     Assessment/Plan Recurrent nausea and vomiting. Chronic GERD. ? Gastric polyp. Diarrhea and hematochezia. Diagnostic EGD and colonoscopy.  CharlesNAJEEB Zuniga 05/27/2012, 2:13 PM

## 2012-05-28 ENCOUNTER — Encounter (HOSPITAL_COMMUNITY): Payer: Self-pay | Admitting: Internal Medicine

## 2012-06-03 ENCOUNTER — Encounter (INDEPENDENT_AMBULATORY_CARE_PROVIDER_SITE_OTHER): Payer: Self-pay | Admitting: *Deleted

## 2012-06-08 ENCOUNTER — Encounter (HOSPITAL_COMMUNITY): Payer: Self-pay | Admitting: Emergency Medicine

## 2012-06-08 ENCOUNTER — Emergency Department (HOSPITAL_COMMUNITY)
Admission: EM | Admit: 2012-06-08 | Discharge: 2012-06-09 | Disposition: A | Discharge status: Home / Self Care | Payer: Medicare Other | Attending: Emergency Medicine | Admitting: Emergency Medicine

## 2012-06-08 ENCOUNTER — Emergency Department (HOSPITAL_COMMUNITY): Payer: Medicare Other

## 2012-06-08 DIAGNOSIS — R609 Edema, unspecified: Secondary | ICD-10-CM

## 2012-06-08 DIAGNOSIS — Z8673 Personal history of transient ischemic attack (TIA), and cerebral infarction without residual deficits: Secondary | ICD-10-CM | POA: Insufficient documentation

## 2012-06-08 DIAGNOSIS — R339 Retention of urine, unspecified: Secondary | ICD-10-CM | POA: Insufficient documentation

## 2012-06-08 DIAGNOSIS — R0602 Shortness of breath: Secondary | ICD-10-CM | POA: Insufficient documentation

## 2012-06-08 DIAGNOSIS — R011 Cardiac murmur, unspecified: Secondary | ICD-10-CM | POA: Insufficient documentation

## 2012-06-08 DIAGNOSIS — I10 Essential (primary) hypertension: Secondary | ICD-10-CM | POA: Insufficient documentation

## 2012-06-08 DIAGNOSIS — N184 Chronic kidney disease, stage 4 (severe): Secondary | ICD-10-CM | POA: Insufficient documentation

## 2012-06-08 DIAGNOSIS — I129 Hypertensive chronic kidney disease with stage 1 through stage 4 chronic kidney disease, or unspecified chronic kidney disease: Secondary | ICD-10-CM | POA: Insufficient documentation

## 2012-06-08 DIAGNOSIS — Z8719 Personal history of other diseases of the digestive system: Secondary | ICD-10-CM | POA: Insufficient documentation

## 2012-06-08 DIAGNOSIS — K219 Gastro-esophageal reflux disease without esophagitis: Secondary | ICD-10-CM | POA: Insufficient documentation

## 2012-06-08 DIAGNOSIS — Z79899 Other long term (current) drug therapy: Secondary | ICD-10-CM | POA: Insufficient documentation

## 2012-06-08 DIAGNOSIS — M7989 Other specified soft tissue disorders: Secondary | ICD-10-CM | POA: Insufficient documentation

## 2012-06-08 DIAGNOSIS — M109 Gout, unspecified: Secondary | ICD-10-CM | POA: Insufficient documentation

## 2012-06-08 HISTORY — DX: Cerebral infarction, unspecified: I63.9

## 2012-06-08 HISTORY — DX: Cardiac murmur, unspecified: R01.1

## 2012-06-08 LAB — CBC WITH DIFFERENTIAL/PLATELET
HCT: 35.8 % — ABNORMAL LOW (ref 39.0–52.0)
Hemoglobin: 12.2 g/dL — ABNORMAL LOW (ref 13.0–17.0)
Lymphocytes Relative: 18 % (ref 12–46)
MCHC: 34.1 g/dL (ref 30.0–36.0)
Monocytes Absolute: 0.8 10*3/uL (ref 0.1–1.0)
Monocytes Relative: 11 % (ref 3–12)
Neutro Abs: 5.1 10*3/uL (ref 1.7–7.7)
WBC: 7.9 10*3/uL (ref 4.0–10.5)

## 2012-06-08 LAB — COMPREHENSIVE METABOLIC PANEL
BUN: 36 mg/dL — ABNORMAL HIGH (ref 6–23)
CO2: 23 mEq/L (ref 19–32)
Chloride: 103 mEq/L (ref 96–112)
Creatinine, Ser: 2.8 mg/dL — ABNORMAL HIGH (ref 0.50–1.35)
GFR calc Af Amer: 25 mL/min — ABNORMAL LOW (ref >90)
GFR calc non Af Amer: 22 mL/min — ABNORMAL LOW (ref >90)
Total Bilirubin: 0.6 mg/dL (ref 0.3–1.2)

## 2012-06-08 LAB — TROPONIN I: Troponin I: <0.3 ng/mL (ref <0.30)

## 2012-06-08 NOTE — ED Notes (Signed)
Patient ambulated around ED, Did not become visibly short of breath, o2 sats maintained 97%.

## 2012-06-08 NOTE — ED Notes (Signed)
Pt states SOB that comes and goes, denies any at current, lungs clear, NAD. Also states he has not urinated today, denies the urge to and states he has worsening renal failure and is set to start dialysis soon. Shunt in place on left upper arm. 2+ pitting edema noted to lower legs bilaterally.

## 2012-06-08 NOTE — ED Notes (Signed)
Patient reports shortness of breath and lower extremity edema that he noticed yesterday, pitting edema noted to lower extremities bilaterally. Also reports urinary retention.

## 2012-06-08 NOTE — ED Provider Notes (Signed)
History  This chart was scribed for [employer]. Colon Branch, MD by Bennett Scrape, ED Scribe. This patient was seen in room [facility]08/[facility]08 and the patient's care was started at 11:09 PM.  CSN: [id number]  Arrival date & time 06/08/12  2120   First MD Initiated Contact with Patient 06/08/12 2256      Chief Complaint  Patient presents with  . Leg Swelling  . Shortness of Breath  . Urinary Retention    The history is provided by the patient and a relative (daughter). No language interpreter was used.    HPI Comments: Charles Zuniga is a 67 y.o. male with a h/o renal disorder and HTN who presents to the Emergency Department complaining of gradual onset, gradually worsening, constant SOB with associated bilateral lower extremity swelling that started yesterday. He reports that the SOB is worse with ambulation and that the lower extremity swelling decreased over night. Daughter states that she was told to bring the pt to the ED due to the swelling. Pt denies CP, abdominal pain and weakness as associated symptoms. He denies smoking and alcohol use.   PCP is with [facility]  Past Medical History  Diagnosis Date  . Hypertension   . Renal disorder   . Gout     occasional  . CKD (chronic kidney disease) stage 4, GFR 15-29 ml/min   . Diverticulitis   . Gout   . Mass on back   . GERD (gastroesophageal reflux disease)   . Heart murmur   . Stroke     Past Surgical History  Procedure Laterality Date  . Kidney surgery    . Av fistula placement Left   . Colonoscopy with esophagogastroduodenoscopy (egd) N/A 05/27/2012    Procedure: COLONOSCOPY WITH ESOPHAGOGASTRODUODENOSCOPY (EGD);  Surgeon: Malissa Hippo, MD;  Location: [facility];  Service: Endoscopy;  Laterality: N/A;  100-moved to 115 Ann to notify pt    History reviewed. No pertinent family history.  History  Substance Use Topics  . Smoking status: Never Smoker   . Smokeless tobacco: Never Used  . Alcohol Use: No      Review of  Systems  Constitutional: Negative for fever.       10 systems reviewed and are negative for acute change except as noted in the HPI  HENT: Negative for congestion.   Eyes: Negative for discharge and redness.  Respiratory: Positive for shortness of breath. Negative for cough.   Cardiovascular: Positive for leg swelling. Negative for chest pain.  Gastrointestinal: Negative for vomiting and abdominal pain.  Musculoskeletal: Negative for back pain.  Skin: Negative for rash.  Neurological: Negative for syncope, numbness and headaches.  Psychiatric/Behavioral:       No behavior change    Allergies  Clonidine derivatives  Home Medications   Current Outpatient Rx  Name  Route  Sig  Dispense  Refill  . acetaminophen (TYLENOL) 325 MG tablet   Oral   Take 650 mg by mouth at bedtime as needed for pain.          Marland Kitchen amLODipine (NORVASC) 10 MG tablet   Oral   Take 10 mg by mouth daily.         Marland Kitchen atorvastatin (LIPITOR) 40 MG tablet   Oral   Take 1 tablet by mouth daily.         . cholecalciferol (VITAMIN D) 1000 UNITS tablet   Oral   Take 1,000 Units by mouth daily.         Marland Kitchen  citalopram (CELEXA) 10 MG tablet   Oral   Take 10 mg by mouth every morning.         Marland Kitchen losartan (COZAAR) 100 MG tablet   Oral   Take 1 tablet by mouth daily.         . metoCLOPramide (REGLAN) 5 MG tablet   Oral   Take 1 tablet (5 mg total) by mouth 4 (four) times daily.   60 tablet   0   . metoprolol succinate (TOPROL-XL) 25 MG 24 hr tablet   Oral   Take 1 tablet by mouth daily.         Marland Kitchen omeprazole (PRILOSEC) 20 MG capsule   Oral   Take 20 mg by mouth daily.         . ondansetron (ZOFRAN-ODT) 4 MG disintegrating tablet   Oral   Take 1 tablet (4 mg total) by mouth every 12 (twelve) hours as needed for nausea.   20 tablet   1   . pantoprazole (PROTONIX) 40 MG tablet   Oral   Take 40 mg by mouth every morning.         Marland Kitchen spironolactone (ALDACTONE) 25 MG tablet   Oral   Take  25 mg by mouth daily.          Marland Kitchen allopurinol (ZYLOPRIM) 100 MG tablet   Oral   Take 200 mg by mouth as needed (for gout flare).          . clonazePAM (KLONOPIN) 0.5 MG tablet   Oral   Take 0.5 mg by mouth 2 (two) times daily as needed. For anxiety         . hydrALAZINE (APRESOLINE) 50 MG tablet   Oral   Take 50 mg by mouth every morning. May also Take 1 tablet in the evening if needed           Triage Vitals: BP 160/90  Pulse 66  Temp(Src) 97.4 F (36.3 C) (Oral)  Ht 5\' 10"  (1.778 m)  Wt 170 lb (77.111 kg)  BMI 24.39 kg/m2  SpO2 100%  Physical Exam  Nursing note and vitals reviewed. Constitutional: He is oriented to person, place, and time. He appears well-developed and well-nourished.  HENT:  Head: Normocephalic and atraumatic.  Eyes: Conjunctivae and EOM are normal.  Neck: Normal range of motion. Neck supple.  Cardiovascular: Normal rate and regular rhythm.   Murmur heard.  Systolic murmur is present   Diastolic murmur is present  Pulmonary/Chest: Effort normal and breath sounds normal.  Abdominal: Soft. Bowel sounds are normal.  Musculoskeletal: Normal range of motion.  Mild bilateral peripheral edema to lower extremities   Neurological: He is alert and oriented to person, place, and time.  Skin: Skin is warm and dry.  Psychiatric: He has a normal mood and affect.    ED Course  Procedures (including critical care time) Results for orders placed during the hospital encounter of 06/08/12  CBC WITH DIFFERENTIAL      Result Value Range   WBC 7.9  4.0 - 10.5 K/uL   RBC 4.01 (*) 4.22 - 5.81 MIL/uL   Hemoglobin 12.2 (*) 13.0 - 17.0 g/dL   HCT 16.1 (*) 09.6 - 04.5 %   MCV 89.3  78.0 - 100.0 fL   MCH 30.4  26.0 - 34.0 pg   MCHC 34.1  30.0 - 36.0 g/dL   RDW 40.9  81.1 - 91.4 %   Platelets 179  150 - 400 K/uL   Neutrophils  Relative 65  43 - 77 %   Neutro Abs 5.1  1.7 - 7.7 K/uL   Lymphocytes Relative 18  12 - 46 %   Lymphs Abs 1.5  0.7 - 4.0 K/uL    Monocytes Relative 11  3 - 12 %   Monocytes Absolute 0.8  0.1 - 1.0 K/uL   Eosinophils Relative 5  0 - 5 %   Eosinophils Absolute 0.4  0.0 - 0.7 K/uL   Basophils Relative 1  0 - 1 %   Basophils Absolute 0.1  0.0 - 0.1 K/uL  COMPREHENSIVE METABOLIC PANEL      Result Value Range   Sodium 138  135 - 145 mEq/L   Potassium 4.4  3.5 - 5.1 mEq/L   Chloride 103  96 - 112 mEq/L   CO2 23  19 - 32 mEq/L   Glucose, Bld 138 (*) 70 - 99 mg/dL   BUN 36 (*) 6 - 23 mg/dL   Creatinine, Ser 5.78 (*) 0.50 - 1.35 mg/dL   Calcium 9.4  8.4 - 46.9 mg/dL   Total Protein 6.8  6.0 - 8.3 g/dL   Albumin 3.6  3.5 - 5.2 g/dL   AST 17  0 - 37 U/L   ALT 14  0 - 53 U/L   Alkaline Phosphatase 93  39 - 117 U/L   Total Bilirubin 0.6  0.3 - 1.2 mg/dL   GFR calc non Af Amer 22 (*) >90 mL/min   GFR calc Af Amer 25 (*) >90 mL/min  TROPONIN I      Result Value Range   Troponin I <0.30  <0.30 ng/mL   DIAGNOSTIC STUDIES: Oxygen Saturation is 100% on room air, normal by my interpretation.    COORDINATION OF CARE: 11:12 PM-Informed pt of radiology report. Advised that I'm still waiting on lab work. Stated that I would not change or add medications for leg swelling due to how mild it is. Advised pt that he can f/u with his PCP next month as planned. Pt agreed to plan.   Labs Reviewed  CBC WITH DIFFERENTIAL - Abnormal; Notable for the following:    RBC 4.01 (*)    Hemoglobin 12.2 (*)    HCT 35.8 (*)    All other components within normal limits  COMPREHENSIVE METABOLIC PANEL - Abnormal; Notable for the following:    Glucose, Bld 138 (*)    BUN 36 (*)    Creatinine, Ser 2.80 (*)    GFR calc non Af Amer 22 (*)    GFR calc Af Amer 25 (*)    All other components within normal limits  TROPONIN I   Dg Chest Port 1 View  06/08/2012  *RADIOLOGY REPORT*  Clinical Data: 67 year old male with shortness of breath, weakness, edema.  PORTABLE CHEST - 1 VIEW  Comparison: 01/14/2008.  Findings: Portable upright AP view at 2244  hours.  Retrocardiac hiatal hernia re-identified.  Stable cardiac size and mediastinal contours.  Visualized tracheal air column is within normal limits. No pneumothorax or pulmonary edema.  No definite pleural effusion or confluent pulmonary opacity.  IMPRESSION: No acute cardiopulmonary abnormality.  Chronic hiatal hernia.   Original Report Authenticated By: Erskine Speed, M.D.     Date: 06/08/2012   2156  Rate: 69  Rhythm: normal sinus rhythm  QRS Axis: normal  Intervals: normal  ST/T Wave abnormalities: normal  Conduction Disutrbances: none  Narrative Interpretation: unremarkable        MDM  Patient with shortness of  breath with walking and slight peripheral edema. Labs show continued renal insufficiency. Xray is normal . Patient was ambulated in the hallway. O2 sats remained 100%. Reviewed results with patient and his family. Pt stable in ED with no significant deterioration in condition.The patient appears reasonably screened and/or stabilized for discharge and I doubt any other medical condition or other [facility] requiring further screening, evaluation, or treatment in the ED at this time prior to discharge.  I personally performed the services described in this documentation, which was scribed in my presence. The recorded information has been reviewed and considered.   MDM Reviewed: nursing note and vitals Interpretation: labs, ECG and x-ray           Nicoletta Dress. Colon Branch, MD 06/09/12 1610

## 2012-06-08 NOTE — ED Notes (Signed)
Dr Colon Branch in room assesssing pt at this time

## 2013-03-04 ENCOUNTER — Encounter (HOSPITAL_COMMUNITY): Payer: Self-pay | Admitting: Pharmacy Technician

## 2013-03-16 ENCOUNTER — Inpatient Hospital Stay (HOSPITAL_COMMUNITY)
Admission: AD | Admit: 2013-03-16 | Discharge: 2013-03-18 | DRG: 247 | Disposition: A | Discharge status: Home / Self Care | Payer: Medicare Other | Source: Ambulatory Visit | Attending: Cardiology | Admitting: Cardiology

## 2013-03-16 DIAGNOSIS — I209 Angina pectoris, unspecified: Secondary | ICD-10-CM | POA: Diagnosis present

## 2013-03-16 DIAGNOSIS — E1159 Type 2 diabetes mellitus with other circulatory complications: Secondary | ICD-10-CM

## 2013-03-16 DIAGNOSIS — Z9861 Coronary angioplasty status: Secondary | ICD-10-CM

## 2013-03-16 DIAGNOSIS — I059 Rheumatic mitral valve disease, unspecified: Secondary | ICD-10-CM | POA: Diagnosis present

## 2013-03-16 DIAGNOSIS — I2510 Atherosclerotic heart disease of native coronary artery without angina pectoris: Principal | ICD-10-CM | POA: Diagnosis present

## 2013-03-16 DIAGNOSIS — K219 Gastro-esophageal reflux disease without esophagitis: Secondary | ICD-10-CM | POA: Diagnosis present

## 2013-03-16 DIAGNOSIS — N184 Chronic kidney disease, stage 4 (severe): Secondary | ICD-10-CM | POA: Diagnosis present

## 2013-03-16 DIAGNOSIS — Z8673 Personal history of transient ischemic attack (TIA), and cerebral infarction without residual deficits: Secondary | ICD-10-CM

## 2013-03-16 DIAGNOSIS — I359 Nonrheumatic aortic valve disorder, unspecified: Secondary | ICD-10-CM | POA: Diagnosis present

## 2013-03-16 DIAGNOSIS — I129 Hypertensive chronic kidney disease with stage 1 through stage 4 chronic kidney disease, or unspecified chronic kidney disease: Secondary | ICD-10-CM | POA: Diagnosis present

## 2013-03-16 DIAGNOSIS — E785 Hyperlipidemia, unspecified: Secondary | ICD-10-CM | POA: Diagnosis present

## 2013-03-16 DIAGNOSIS — I079 Rheumatic tricuspid valve disease, unspecified: Secondary | ICD-10-CM | POA: Diagnosis present

## 2013-03-16 HISTORY — DX: Personal history of other diseases of the digestive system: Z87.19

## 2013-03-16 HISTORY — DX: Pure hypercholesterolemia, unspecified: E78.00

## 2013-03-16 HISTORY — DX: Personal history of peptic ulcer disease: Z87.11

## 2013-03-16 HISTORY — DX: Anxiety disorder, unspecified: F41.9

## 2013-03-16 LAB — CBC
HCT: 38.3 % — ABNORMAL LOW (ref 39.0–52.0)
HEMOGLOBIN: 13.4 g/dL (ref 13.0–17.0)
MCH: 32.2 pg (ref 26.0–34.0)
MCHC: 35 g/dL (ref 30.0–36.0)
MCV: 92.1 fL (ref 78.0–100.0)
Platelets: 165 10*3/uL (ref 150–400)
RBC: 4.16 MIL/uL — AB (ref 4.22–5.81)
RDW: 13.1 % (ref 11.5–15.5)
WBC: 6.2 10*3/uL (ref 4.0–10.5)

## 2013-03-16 LAB — BASIC METABOLIC PANEL
BUN: 41 mg/dL — ABNORMAL HIGH (ref 6–23)
CALCIUM: 9.1 mg/dL (ref 8.4–10.5)
CO2: 19 meq/L (ref 19–32)
Chloride: 103 mEq/L (ref 96–112)
Creatinine, Ser: 3.23 mg/dL — ABNORMAL HIGH (ref 0.50–1.35)
GFR calc Af Amer: 21 mL/min — ABNORMAL LOW (ref >90)
GFR, EST NON AFRICAN AMERICAN: 18 mL/min — AB (ref >90)
GLUCOSE: 136 mg/dL — AB (ref 70–99)
POTASSIUM: 4 meq/L (ref 3.7–5.3)
Sodium: 141 mEq/L (ref 137–147)

## 2013-03-16 MED ORDER — SODIUM CHLORIDE 0.9 % IV SOLN
INTRAVENOUS | Status: DC
  Administered 2013-03-16 – 2013-03-17 (×3): via INTRAVENOUS

## 2013-03-16 MED ORDER — SODIUM CHLORIDE 0.9 % IJ SOLN
3.0000 mL | Freq: Two times a day (BID) | INTRAMUSCULAR | Status: DC
  Administered 2013-03-17: 3 mL via INTRAVENOUS

## 2013-03-16 MED ORDER — ATORVASTATIN CALCIUM 40 MG PO TABS
40.0000 mg | ORAL_TABLET | Freq: Every day | ORAL | Status: DC
  Administered 2013-03-16 – 2013-03-18 (×3): 40 mg via ORAL
  Filled 2013-03-16 (×4): qty 1

## 2013-03-16 MED ORDER — SODIUM CHLORIDE 0.9 % IJ SOLN: 3.0000 mL | INTRAMUSCULAR | Status: DC | PRN

## 2013-03-16 MED ORDER — ASPIRIN EC 81 MG PO TBEC
81.0000 mg | DELAYED_RELEASE_TABLET | Freq: Every day | ORAL | Status: DC
  Administered 2013-03-17: 81 mg via ORAL
  Filled 2013-03-16 (×2): qty 1

## 2013-03-16 MED ORDER — SODIUM CHLORIDE 0.9 % IV SOLN: 250.0000 mL | INTRAVENOUS | Status: DC | PRN

## 2013-03-16 MED ORDER — SODIUM CHLORIDE 0.9 % IJ SOLN: 3.0000 mL | Freq: Two times a day (BID) | INTRAMUSCULAR | Status: DC

## 2013-03-16 MED ORDER — ACETAMINOPHEN 500 MG PO TABS: 500.0000 mg | ORAL_TABLET | Freq: Four times a day (QID) | ORAL | Status: DC | PRN

## 2013-03-16 MED ORDER — ISOSORBIDE MONONITRATE ER 60 MG PO TB24
120.0000 mg | ORAL_TABLET | Freq: Every day | ORAL | Status: DC
  Administered 2013-03-17 – 2013-03-18 (×3): 120 mg via ORAL
  Filled 2013-03-16 (×4): qty 2

## 2013-03-16 MED ORDER — METOPROLOL SUCCINATE ER 50 MG PO TB24
50.0000 mg | ORAL_TABLET | Freq: Every day | ORAL | Status: DC
  Administered 2013-03-17 – 2013-03-18 (×3): 50 mg via ORAL
  Filled 2013-03-16 (×4): qty 1

## 2013-03-16 MED ORDER — PANTOPRAZOLE SODIUM 40 MG PO TBEC
40.0000 mg | DELAYED_RELEASE_TABLET | Freq: Every morning | ORAL | Status: DC
  Administered 2013-03-17 – 2013-03-18 (×2): 40 mg via ORAL

## 2013-03-16 MED ORDER — SODIUM BICARBONATE 650 MG PO TABS
650.0000 mg | ORAL_TABLET | Freq: Two times a day (BID) | ORAL | Status: DC
  Administered 2013-03-16 – 2013-03-18 (×4): 650 mg via ORAL
  Filled 2013-03-16 (×6): qty 1

## 2013-03-16 MED ORDER — ASPIRIN 81 MG PO CHEW: 81.0000 mg | CHEWABLE_TABLET | ORAL | Status: DC

## 2013-03-16 NOTE — H&P (Signed)
Charles Zuniga is an 68 y.o. male.   Chief Complaint: Chest pain. HPI: The patient is a 68 year old male who presents for follow up of Chest pain. Charles Zuniga is a 68 year old Caucasian male with history of stage IV chronic kidney disease, who has had chest discomfort suggestive of angina pectoris, also marked dyspnea presents here for 3 month follow-up.  Over the past one month, he has noticed that he has used or frequent sublingual nitroglycerin.  I'd seen him 3 weeks ago, started him on Imdur, although his blood pressure was elevated, patient  and his daughter do not want to be on any other medications.  They state that he'll get extremely weak with any that pressure control.  He still continues to have significant chest discomfort and states that his anginal symptoms have not improved and want to proceed with coronary angiography. They're discussed proceeding with coronary angiography with his nephrologist.  Patient states that his shortness of breath and dyspnea on exertion is getting worse over time and unable to do his usual activities at home.   No orthopnea or PND. No swelling on legs now. He gets occ. pain in calves on walking.  He has felt occasional palpitations at night and gives history of occasional dizziness.  No history of near-syncope or syncope.  Patient has history of severe hypertension. His systolic blood pressure at home remains around 150-160, occasionally 130.  He also has hyperlipidemia. No h/o DM, he does not smoke. There is history of stage IV renal failure.  Patient had AV fistula made, may be started on dialysis anytime.  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 He is followed by Nephrologist Dr. Fran Lowes in [place].  Pt. has Mild AS, AR and MR by echo.  He had the CVA diagnosed by head CT scan. Patient did not have any medical symptoms of stroke.  He has history of hyper parathyroidism.   Past Medical History  Diagnosis Date  . Hypertension   .  Renal disorder   . Gout     occasional  . CKD (chronic kidney disease) stage 4, GFR 15-29 ml/min   . Diverticulitis   . Gout   . Mass on back   . GERD (gastroesophageal reflux disease)   . Heart murmur   . Stroke     Past Surgical History  Procedure Laterality Date  . Kidney surgery    . Av fistula placement Left   . Colonoscopy with esophagogastroduodenoscopy (egd) N/A 05/27/2012    Procedure: COLONOSCOPY WITH ESOPHAGOGASTRODUODENOSCOPY (EGD);  Surgeon: Rogene Houston, MD;  Location: [facility];  Service: Endoscopy;  Laterality: N/A;  100-moved to 115 Ann to notify pt    No family history on file. Social History:  reports that he has never smoked. He has never used smokeless tobacco. He reports that he does not drink alcohol or use illicit drugs.  Allergies:  Allergies  Allergen Reactions  . Clonidine Derivatives Other (See Comments)    Hallucinations, anxious,insomnia    Medications Prior to Admission  Medication Sig Dispense Refill  . acetaminophen (TYLENOL) 500 MG tablet Take 500 mg by mouth every 6 (six) hours as needed for moderate pain.       Marland Kitchen aspirin EC 81 MG tablet Take 81 mg by mouth daily.      Marland Kitchen atorvastatin (LIPITOR) 40 MG tablet Take 40 mg by mouth daily.       . furosemide (LASIX) 40 MG tablet Take 40  mg by mouth daily.      . isosorbide mononitrate (IMDUR) 120 MG 24 hr tablet Take 120 mg by mouth daily.      Marland Kitchen losartan (COZAAR) 100 MG tablet Take 100 mg by mouth daily.       . metoprolol succinate (TOPROL-XL) 50 MG 24 hr tablet Take 50 mg by mouth daily. Take with or immediately following a meal.      . pantoprazole (PROTONIX) 40 MG tablet Take 40 mg by mouth every morning.      . sodium bicarbonate 650 MG tablet Take 650 mg by mouth 2 (two) times daily.      Marland Kitchen spironolactone (ALDACTONE) 25 MG tablet Take 25 mg by mouth daily.           Review of Systems -  General: Not Present- Fatigue, Fever and Night Sweats. Skin: Not Present- Rash. HEENT: Not  Present- Headache. Respiratory: Not Present- Bloody sputum and Wakes up from Sleep Wheezing or Short of Breath. Cardiovascular: Not Present- Claudications, Fainting, Orthopnea and Swelling of Extremities. Gastrointestinal: Present- Diarrhea (frequent diarrhoea chronic). Not Present- Abdominal Pain, Constipation, Nausea and Vomiting. Musculoskeletal: Present- Back Pain and Joint Pain (back, all joints). Not Present- Joint Swelling. Neurological: Not Present- Headaches. Hematology: Not Present- Blood Clots, Easy Bruising and Nose Bleed.   Blood pressure 158/55, pulse 63, temperature 98.5 F (36.9 C), temperature source Oral, resp. rate 18, height 5\' 5"  (1.651 m), weight 78.019 kg (172 lb), SpO2 98.00%.  GENERAL: Normal built and normal  body habitus, who is in no acute distress. Alert and oriented  x 3. Appears older than  stated age.    HEENT: normal limits, No JVD.    CARDIAC EXAM: S1 S2 normal, no gallop, III/VI early systolic murmur noted in apex and right sternal border.     CHEST EXAM: No tenderness of chest wall.  LUNGS: Clear to percuss and auscultate. No rales or ronchi.    ABDOMEN: No hepatosplenomegaly.  BS normal in all 4 quadrants. Abdomen is non-tender.    EXTREMITY: Full range of movementes, No edema.  MUSCULOSKELETAL EXAM: Intact with full range of motion in all 4 extremities.    NEUROLOGIC EXAM: Grossly intact without any focal deficits. Alert O x 3.     VASCULAR EXAM: No skin breakdown. Carotids bilateral loud bruit present. Extremities: Femoral pulse normal. Popliteal pulse normal ; Pedal pulse normal.  Otherwise no bruit.  No prominent pulse felt in the abdomen. No varicose veins.  Labs:   Lab Results  Component Value Date   WBC 7.9 06/08/2012   HGB 12.2* 06/08/2012   HCT 35.8* 06/08/2012   MCV 89.3 06/08/2012   PLT 179 06/08/2012   No results found for this basename: NA, K, CL, CO2, BUN, CREATININE, CALCIUM, LABALBU, PROT, BILITOT, ALKPHOS, ALT, AST, GLUCOSE,  in the last  168 hours  EKG 02/09/2013: Sinus bradycardia at the rate of 55 bpm, left atrial abnormality, no evidence of ischemia. Otherwise normal EKG.   Assessment/Plan 1. Angina pectoris Lexiscan Myoview stress test 09/29/2012: 1. Resting EKG NSR, early repolarization. Stress EKG was non diagnostic for ischemia. No ST-T changes of ischemia noted with pharmacologic stress testing. Stress terminated due to completion of protocol. 2. The perfusion imaging study demonstrates mild diaphragmatic attenuation in the inferior wall, but no statistically significant ischemia, although perfusion imaging studies demonstrate a very mild small to moderate-sized lateral wall ischemia which does not achieved statistical significance. Dynamic gated images reveal normal wall motion and endocardial thickening. Left  ventricular ejection fraction was estimated to be 53%. This represents a low risk study.  2. Aortic stenosis, mild  Echo 10/08/2012: 1. Left ventricular cavity is normal in size. Mild concentric hypertrophy. Normal global wall motion. Lower limits systolic global function. Calculated EF 50%. Visual EF is 50-55%. Doppler evidence of Grade II (pseudonormal) diastolic dysfunction. 2. Left atrial cavity is mildly dilated. 3. Mild calcification of the aortic annulus. Mild aortic regurgitation. Mild aortic stenosis with approx. peak gradient 31 & mean gradient 20 mm of Hg. . Moderate aortic valve leaflet calcification. Aortic valve leaflet mobility is mildly restricted. 4. Mitral valve structurally normal. Mild mitral regurgitation. Abnormal mitral valve inflow. 5. Tricuspid valve structurally normal. Mild tricuspid regurgitation.  3. Kidney failure (586) Story: stage 4  4. Bilateral carotid bruits  5. Benign essential hypertension.  Rec:  Mr. Charles Zuniga is a 68 year old Caucasian male who is accompanied by her daughter at the bedside who presents to me for follow-up and evaluation of  chest pain suggestive of angina pectoris.  Patient does not want to be treated aggressively for his hypertension and they are resistant to making any changes. Previously had started him on Imdur and in spite of this patient continues to have significant chest pain and they have come here prepared that he should have coronary angiography.  I will set him up for coronary angiography, we will admit the patient  for IV hydration, will discontinue spironolactone and losartan  to avoid contrast nephropathy I will also reduce the amount of contrast that I will be using. Staged procedure was also discussed with the patient and his daughter at the bedside. They're aware of a 3-5% risk of complete renal failure needing permanent dialysis and at least a 10-15% risk of temporary dialysis with contrast use. No other changes in the medications were done today. Office visit after the angiogram. I will certainly keep his nephrologist informed.   Laverda Page, MD 03/16/2013, 5:45 PM [facility] [place] Pager: [phone] Office: [phone] If no answer: Cell:  [phone]

## 2013-03-17 ENCOUNTER — Encounter (HOSPITAL_COMMUNITY)
Admission: AD | Disposition: A | Discharge status: Home / Self Care | Payer: Medicare Other | Source: Ambulatory Visit | Attending: Cardiology

## 2013-03-17 ENCOUNTER — Encounter (HOSPITAL_COMMUNITY): Payer: Self-pay | Admitting: General Practice

## 2013-03-17 HISTORY — PX: CORONARY ANGIOPLASTY WITH STENT PLACEMENT: SHX49

## 2013-03-17 HISTORY — PX: LEFT HEART CATHETERIZATION WITH CORONARY ANGIOGRAM: SHX5451

## 2013-03-17 LAB — PROTIME-INR
INR: 1.04 (ref 0.00–1.49)
Prothrombin Time: 13.4 seconds (ref 11.6–15.2)

## 2013-03-17 LAB — APTT: APTT: 32 s (ref 24–37)

## 2013-03-17 LAB — BASIC METABOLIC PANEL
BUN: 39 mg/dL — ABNORMAL HIGH (ref 6–23)
BUN: 31 mg/dL — ABNORMAL HIGH (ref 6–23)
CHLORIDE: 110 meq/L (ref 96–112)
CO2: 18 mEq/L — ABNORMAL LOW (ref 19–32)
CO2: 17 mEq/L — ABNORMAL LOW (ref 19–32)
Calcium: 8.5 mg/dL (ref 8.4–10.5)
Calcium: 8.1 mg/dL — ABNORMAL LOW (ref 8.4–10.5)
Chloride: 111 mEq/L (ref 96–112)
Creatinine, Ser: 2.87 mg/dL — ABNORMAL HIGH (ref 0.50–1.35)
Creatinine, Ser: 2.61 mg/dL — ABNORMAL HIGH (ref 0.50–1.35)
GFR calc Af Amer: 24 mL/min — ABNORMAL LOW (ref >90)
GFR calc non Af Amer: 21 mL/min — ABNORMAL LOW (ref >90)
GFR, EST AFRICAN AMERICAN: 27 mL/min — AB (ref >90)
GFR, EST NON AFRICAN AMERICAN: 24 mL/min — AB (ref >90)
GLUCOSE: 92 mg/dL (ref 70–99)
GLUCOSE: 157 mg/dL — AB (ref 70–99)
POTASSIUM: 3.7 meq/L (ref 3.7–5.3)
POTASSIUM: 3.6 meq/L — AB (ref 3.7–5.3)
SODIUM: 146 meq/L (ref 137–147)
SODIUM: 143 meq/L (ref 137–147)

## 2013-03-17 LAB — POCT ACTIVATED CLOTTING TIME
ACTIVATED CLOTTING TIME: 343 s
ACTIVATED CLOTTING TIME: 166 s

## 2013-03-17 SURGERY — LEFT HEART CATHETERIZATION WITH CORONARY ANGIOGRAM
Anesthesia: LOCAL

## 2013-03-17 MED ORDER — ASPIRIN 81 MG PO CHEW
81.0000 mg | CHEWABLE_TABLET | Freq: Every day | ORAL | Status: DC
  Administered 2013-03-18: 10:00:00 81 mg via ORAL
  Filled 2013-03-17: qty 1

## 2013-03-17 MED ORDER — TICAGRELOR 90 MG PO TABS
ORAL_TABLET | ORAL | Status: AC
  Filled 2013-03-17: qty 2

## 2013-03-17 MED ORDER — NITROGLYCERIN 0.2 MG/ML ON CALL CATH LAB
INTRAVENOUS | Status: AC
  Filled 2013-03-17: qty 1

## 2013-03-17 MED ORDER — NITROGLYCERIN 0.4 MG/SPRAY TL SOLN: 1.0000 | Freq: Once | Status: DC

## 2013-03-17 MED ORDER — BIVALIRUDIN 250 MG IV SOLR
INTRAVENOUS | Status: AC
  Filled 2013-03-17: qty 250

## 2013-03-17 MED ORDER — HEPARIN (PORCINE) IN NACL 2-0.9 UNIT/ML-% IJ SOLN
INTRAMUSCULAR | Status: AC
  Filled 2013-03-17: qty 1500

## 2013-03-17 MED ORDER — ONDANSETRON HCL 4 MG/2ML IJ SOLN
4.0000 mg | Freq: Four times a day (QID) | INTRAMUSCULAR | Status: DC | PRN
  Administered 2013-03-17: 4 mg via INTRAVENOUS
  Filled 2013-03-17: qty 2

## 2013-03-17 MED ORDER — NITROGLYCERIN 0.4 MG SL SUBL: 0.4000 mg | SUBLINGUAL_TABLET | SUBLINGUAL | Status: DC

## 2013-03-17 MED ORDER — ZOLPIDEM TARTRATE 5 MG PO TABS: 5.0000 mg | ORAL_TABLET | Freq: Every evening | ORAL | Status: DC | PRN

## 2013-03-17 MED ORDER — TICAGRELOR 90 MG PO TABS
90.0000 mg | ORAL_TABLET | Freq: Two times a day (BID) | ORAL | Status: DC
  Administered 2013-03-17 – 2013-03-18 (×2): 90 mg via ORAL
  Filled 2013-03-17 (×3): qty 1

## 2013-03-17 MED ORDER — MIDAZOLAM HCL 2 MG/2ML IJ SOLN
INTRAMUSCULAR | Status: AC
  Filled 2013-03-17: qty 2

## 2013-03-17 MED ORDER — LIDOCAINE HCL (PF) 1 % IJ SOLN
INTRAMUSCULAR | Status: AC
  Filled 2013-03-17: qty 30

## 2013-03-17 MED ORDER — ADENOSINE 12 MG/4ML IV SOLN
16.0000 mL | Freq: Once | INTRAVENOUS | Status: DC
  Filled 2013-03-17: qty 16

## 2013-03-17 MED ORDER — LABETALOL HCL 5 MG/ML IV SOLN
20.0000 mg | INTRAVENOUS | Status: DC | PRN
  Administered 2013-03-17: 20 mg via INTRAVENOUS
  Filled 2013-03-17: qty 4

## 2013-03-17 MED ORDER — FENTANYL CITRATE 0.05 MG/ML IJ SOLN
INTRAMUSCULAR | Status: AC
  Filled 2013-03-17: qty 2

## 2013-03-17 NOTE — Progress Notes (Signed)
Site area: right groin  Site Prior to Removal:  Level 0  Pressure Applied For 25 MINUTES    Minutes Beginning at 1635  Manual:   yes  Patient Status During Pull:  stable  Post Pull Groin Site:  Level 0  Post Pull Instructions Given:  yes  Post Pull Pulses Present:  yes  Dressing Applied:  yes  Comments:  Gauze pressure dressing applied at 1700 and rechecked at 1730, dressing remains dry and intact

## 2013-03-17 NOTE — CV Procedure (Signed)
Procedure performed:  Left heart catheterization including selective right and left coronary arteriography.  FFR to the mid LAD intermediate coronary stenosis, Direct stenting of the mid circumflex coronary artery with implantation of a 3.5 x 16 mm promos Premier drug-eluting stent at 11 atmospheric pressure.  Indication patient is a 68 year-old Caucasian  male with history of hypertension (), hyperlipidemia (), stage IV chronic kidney disease  who presents with class III angina pectoris in spite of aggressive medical therapy. Patient has  had   non invasive testing which was abnormal and intermediate risk clinically with inferolateral ischemia.  Patient admitted overnight hydration, then brought to the cardiac catheterization lab to evaluate his coronary anatomy.  Hemodynamic data: Aortic pressure 161/61 with a mean of 92 mmHg.  Right coronary artery: The vessel is smooth with mild luminal irregularity. It is dominant. No significant stenosis. it is mildly calcified in the proximal segment.   Left main coronary artery is large and normal. is mild calcified.   Circumflex coronary artery: A large vessel giving origin to a large obtuse marginal 1, then continues in the AV groove. Just proximal to the bifurcation of OM1 and AV groove branch, has a high-grade 80% stenosis. Again it is mildly calcified proximal circumflex coronary artery.    LAD:  LAD gives origin to a large diagonal 1. LAD has  60-70% mid LAD stenosis prior to the bifurcation of a large D1. No high-grade stenosis was evident.   Interventional data: FFR to the LAD revealed no hemodynamic significance of the intermediate stenosis in the mid LAD. The FFR at baseline was 1.0 and with adenosine infusion in the standard fashion a 0.87, hence the lesion was left alone.  Successful PTCA and direct stenting of the mid circumflex coronary artery with implantation of a 3.5 x 16 mm promos premier drug-eluting stent crossing the AV groove branch  and placing the distal end into the proximal OM1. Stenosis reduced from 80% to 0% with brisk TIMI-3 to TIMI-3 flow maintained at the end of the procedure.   Technique: Under sterile precautions using a 6 French right femoral arterial access, a 6 French sheath was introduced into the right femoral artery under fluoroscopy guidance. A 5 [place] JL 4 diagnostic catheter was utilized to engage the left main coronary artery and angiography was performed. Then I attempted to engage the RCA with a 5 [place] multipurpose B2, because of inability to engage the RCA, a 5 [place] JR 4 diagnostic cath was utilized and angiography was performed. Catheter exchanged out of the body over J-Wire. NO immediate complications noted. Patient tolerated the procedure well.    Technique of intervention:  Using a 6 [place] XB 3.5 guide catheter the left  coronary  was selected and cannulated. Using Angiomax for anticoagulation, I utilized a [employer] as a guidewire and across the  left anterior descending coronary artery without any difficulty. I placed the tip of the wire into the distal  coronary artery. Angiography was performed.  then I performed FFR in the standard fashion using intravenous adenosine. The wire was then withdrawn and directed into the circumflex coronary artery. A uses same wire to perform angioplasty to the circumflex coronary artery. The wire was carefully placed in the obtuse marginal branch and angiography was performed.  I proceeded with implantation of a 3.5x16 mm  promos premier  drug-eluting stent into the  mid circumflex coronary artery. The stent was deployed at  11 atmospheric pressure for  60  seconds. Post-angioplasty results  were excellent with 0% residual stenoses and TIMI-3 flow was maintained. There was no evidence of edge dissection. The guidewire was withdrawn out of the body and the guide catheter was engaged and pulled out of the body over the J-wire the was no immediate  complication. Patient tolerated the procedure well.  Disposition: Patient will be discharged in am unless complications with out-patient follow up. A total of approximately 55 cc of contrast was utilized for diagnostic and interventional procedure.

## 2013-03-17 NOTE — Progress Notes (Signed)
UR completed. Patient changed to inpatient- required stent

## 2013-03-17 NOTE — Interval H&P Note (Signed)
History and Physical Interval Note:  03/17/2013 10:55 AM  Charles Zuniga  has presented today for surgery, with the diagnosis of cp  The various methods of treatment have been discussed with the patient and family. After consideration of risks, benefits and other options for treatment, the patient has consented to  Procedure(s): LEFT HEART CATHETERIZATION WITH CORONARY ANGIOGRAM (N/A) as a surgical intervention .  The patient's history has been reviewed, patient examined, no change in status, stable for surgery.  I have reviewed the patient's chart and labs.  Questions were answered to the patient's satisfaction.   Cath Lab Visit (complete for each Cath Lab visit)  Clinical Evaluation Leading to the Procedure:   ACS: no  Non-ACS:    Anginal Classification: CCS III  Anti-ischemic medical therapy: Maximal Therapy (2 or more classes of medications)  Non-Invasive Test Results: Intermediate-risk stress test findings: cardiac mortality 1-3%/year  Prior CABG: No previous CABG        [facility]

## 2013-03-18 DIAGNOSIS — I209 Angina pectoris, unspecified: Secondary | ICD-10-CM

## 2013-03-18 DIAGNOSIS — I2510 Atherosclerotic heart disease of native coronary artery without angina pectoris: Principal | ICD-10-CM

## 2013-03-18 DIAGNOSIS — E1159 Type 2 diabetes mellitus with other circulatory complications: Secondary | ICD-10-CM

## 2013-03-18 DIAGNOSIS — Z9861 Coronary angioplasty status: Secondary | ICD-10-CM

## 2013-03-18 LAB — CBC
HCT: 30.6 % — ABNORMAL LOW (ref 39.0–52.0)
Hemoglobin: 10.6 g/dL — ABNORMAL LOW (ref 13.0–17.0)
MCH: 32.2 pg (ref 26.0–34.0)
MCHC: 34.6 g/dL (ref 30.0–36.0)
MCV: 93 fL (ref 78.0–100.0)
Platelets: 151 10*3/uL (ref 150–400)
RBC: 3.29 MIL/uL — AB (ref 4.22–5.81)
RDW: 13.5 % (ref 11.5–15.5)
WBC: 6.3 10*3/uL (ref 4.0–10.5)

## 2013-03-18 LAB — BASIC METABOLIC PANEL
BUN: 28 mg/dL — ABNORMAL HIGH (ref 6–23)
CO2: 15 meq/L — AB (ref 19–32)
Calcium: 8 mg/dL — ABNORMAL LOW (ref 8.4–10.5)
Chloride: 112 mEq/L (ref 96–112)
Creatinine, Ser: 2.5 mg/dL — ABNORMAL HIGH (ref 0.50–1.35)
GFR calc Af Amer: 29 mL/min — ABNORMAL LOW (ref >90)
GFR calc non Af Amer: 25 mL/min — ABNORMAL LOW (ref >90)
Glucose, Bld: 92 mg/dL (ref 70–99)
POTASSIUM: 3.9 meq/L (ref 3.7–5.3)
SODIUM: 143 meq/L (ref 137–147)

## 2013-03-18 MED ORDER — LOSARTAN POTASSIUM 100 MG PO TABS: 100.0000 mg | ORAL_TABLET | Freq: Every day | ORAL | Status: AC

## 2013-03-18 MED ORDER — TICAGRELOR 90 MG PO TABS: 90.0000 mg | ORAL_TABLET | Freq: Two times a day (BID) | ORAL | Status: AC

## 2013-03-18 MED ORDER — SPIRONOLACTONE 25 MG PO TABS: 25.0000 mg | ORAL_TABLET | Freq: Every day | ORAL | Status: AC

## 2013-03-18 MED ORDER — NITROGLYCERIN 0.4 MG SL SUBL: 0.4000 mg | SUBLINGUAL_TABLET | SUBLINGUAL | Status: DC

## 2013-03-18 MED FILL — Sodium Chloride IV Soln 0.9%: INTRAVENOUS | Qty: 50 | Status: AC

## 2013-03-18 NOTE — Progress Notes (Signed)
CARDIAC REHAB PHASE I   PRE:  Rate/Rhythm: 64 SR    BP: sitting 145/64    SaO2:   MODE:  Ambulation: 450 ft   POST:  Rate/Rhythm: 82 SR    BP: sitting 160/70     SaO2:   Tolerated well, sts no dyspnea, feels much better. Ed completed including Brilinta (gave booklet), NTG, low sodium, ex. Pt doesn't drive so will not be able to attend [facility] but his family owns a gym so he is thinking of exercising there. To begin with walking. [id number]   Josephina Shih [place] CES, ACSM 03/18/2013 8:59 AM

## 2013-03-18 NOTE — Care Management Note (Signed)
    Page 1 of 1   03/18/2013     12:28:05 PM   CARE MANAGEMENT NOTE 03/18/2013  Patient:  PARVIN, STETZER   Account Number:  [id number]  Date Initiated:  03/18/2013  Documentation initiated by:  Fuller Mandril  Subjective/Objective Assessment:   68 year old Caucasian male with history of stage IV chronic kidney disease, who has had chest discomfort suggestive of angina pectoris//Home with daughter     Action/Plan:   Left heart cath with selective right and left coronary arteriography.  FFR to the mid LAD intermediate coronary stenosis, Direct stenting of the mid circumflex coronary artery with implantation of stent/ Benefits check for Brilinta   Anticipated DC Date:  03/18/2013   Anticipated DC Plan:  [place]  CM consult      Choice offered to / List presented to:             Status of service:   Medicare Important Message given?   (If response is "NO", the following Medicare IM given date fields will be blank) Date Medicare IM given:   Date Additional Medicare IM given:    Discharge Disposition:    Per UR Regulation:    If discussed at Long Length of Stay Meetings, dates discussed:    Comments:  03/18/13 Gerty, RN, BSN, [employer] [phone] Spoke with pt at bedside regarding benefits check for Brilinta.  Pt has brochure with 30 day free card and refill assistance card intact.  Pt utilizes [facility] in [place] for prescription needs.  NCM called pharmacy to confirm availability of medication. Pt will get initial prescription for [facility] pharmacy. Information relayed to pt. Pt verbalizes importance of filling medication upon discharge.

## 2013-03-18 NOTE — Discharge Summary (Signed)
Physician Discharge Summary  Patient ID: Charles Zuniga MRN: [id number] DOB/AGE: January 05, 1946 68 y.o.  Admit date: 03/16/2013 Discharge date: 03/18/2013  Primary Discharge Diagnosis CAD of native vessels. S/P stenting of the mid circumflex coronary artery with implantation of a 3.5 x 16 mm promos Premier drug-eluting stent   Secondary Discharge Diagnosis hypertension Hypertensive renovascular disease, stage IV Mild to moderate aortic stenosis   Significant Diagnostic Studies: coronary angiography-03/17/2013: Hemodynamic data: Aortic pressure 161/61 with a mean of 92 mmHg.  Right coronary artery: The vessel is smooth with mild luminal irregularity. It is dominant. No significant stenosis. it is mildly calcified in the proximal segment.  Left main coronary artery is large and normal. is mild calcified.  Circumflex coronary artery: A large vessel giving origin to a large obtuse marginal 1, then continues in the AV groove. Just proximal to the bifurcation of OM1 and AV groove branch, has a high-grade 80% stenosis. Again it is mildly calcified proximal circumflex coronary artery.  LAD: LAD gives origin to a large diagonal 1. LAD has 60-70% mid LAD stenosis prior to the bifurcation of a large D1. No high-grade stenosis was evident.  Interventional data: FFR to the LAD revealed no hemodynamic significance of the intermediate stenosis in the mid LAD. The FFR at baseline was 1.0 and with adenosine infusion in the standard fashion a 0.87, hence the lesion was left alone.  Successful PTCA and direct stenting of the mid circumflex coronary artery with implantation of a 3.5 x 16 mm promos premier drug-eluting stent crossing the AV groove branch and placing the distal end into the proximal OM1. Stenosis reduced from 80% to 0% with brisk TIMI-3 to TIMI-3 flow maintained at the end of the procedure.   Hospital Course: Charles Zuniga is a 68 year old Caucasian male with history of stage IV chronic kidney disease,  who has had chest discomfort suggestive of angina pectoris, Patient has history of severe hypertension. His systolic blood pressure at home remains around 150-160, occasionally 130. He also has hyperlipidemia. No h/o DM, he does not smoke. There is history of stage IV renal failure. Patient had AV fistula made, may be started on dialysis anytime. 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 He is followed by Nephrologist Dr. Fran Lowes in [place]. Due to class III angina pectoris, patient was admitted to the hospital for hydration followed by coronary angiography. He underwent successful angioplasty to the circumflex coronary artery with excellent results. A total of 50-55 cc of contrast was used for the procedure.  Patient's serum creatinine remained stable at the time of discharge, serum creatinine was 2.50. He'll be discharged home today with outpatient followup. Patient ablated with the help of cardiac rehabilitation, states that he did not have angina pectoris as he used to before angioplasty.  Recommendations on discharge:  I have advised the patient to hold off on spironolactone for one more day, to hold off on losartan for one more day and he'll restart his 1 day after the discharge. Patient also prescribed BRILINTA and he'll continue aspirin as dual antiplatelet regimen.  Discharge Exam: Blood pressure 145/64, pulse 66, temperature 98.5 F (36.9 C), temperature source Oral, resp. rate 18, height 5\' 5"  (1.651 m), weight 77.7 kg (171 lb 4.8 oz), SpO2 95.00%.  GENERAL: Normal built and normal body habitus, who is in no acute distress. Alert and oriented x 3. Appears older than stated age.  HEENT: normal limits, No JVD.  CARDIAC EXAM: S1 S2 normal, no gallop, III/VI early  systolic murmur noted in apex and right sternal border.  CHEST EXAM: No tenderness of chest wall. LUNGS: Clear to percuss and auscultate. No rales or ronchi.  ABDOMEN: No hepatosplenomegaly. BS normal in all 4  quadrants. Abdomen is non-tender.  EXTREMITY: Full range of movementes, No edema. MUSCULOSKELETAL EXAM: Intact with full range of motion in all 4 extremities. Right femoral arterial access site without any hematoma. NEUROLOGIC EXAM: Grossly intact without any focal deficits. Alert O x 3.   Labs:   Lab Results  Component Value Date   WBC 6.3 03/18/2013   HGB 10.6* 03/18/2013   HCT 30.6* 03/18/2013   MCV 93.0 03/18/2013   PLT 151 03/18/2013    Recent Labs Lab 03/18/13 0256  NA 143  K 3.9  CL 112  CO2 15*  BUN 28*  CREATININE 2.50*  CALCIUM 8.0*  GLUCOSE 92   EKG: 03/18/2013: Normal sinus rhythm, left hypertrophy with repolarization abnormality. No evidence of ischemia. No significant change from prior EKG to stress test pain.  FOLLOW UP PLANS AND APPOINTMENTS    Medication List         acetaminophen 500 MG tablet  Commonly known as:  TYLENOL  Take 500 mg by mouth every 6 (six) hours as needed for moderate pain.     aspirin EC 81 MG tablet  Take 81 mg by mouth daily.     atorvastatin 40 MG tablet  Commonly known as:  LIPITOR  Take 40 mg by mouth daily.     furosemide 40 MG tablet  Commonly known as:  LASIX  Take 40 mg by mouth daily.     isosorbide mononitrate 120 MG 24 hr tablet  Commonly known as:  IMDUR  Take 120 mg by mouth daily.     losartan 100 MG tablet  Commonly known as:  COZAAR  Take 1 tablet (100 mg total) by mouth daily.  Start taking on:  03/19/2013     metoprolol succinate 50 MG 24 hr tablet  Commonly known as:  TOPROL-XL  Take 50 mg by mouth daily. Take with or immediately following a meal.     nitroGLYCERIN 0.4 MG SL tablet  Commonly known as:  NITROSTAT  Place 1 tablet (0.4 mg total) under the tongue now.     pantoprazole 40 MG tablet  Commonly known as:  PROTONIX  Take 40 mg by mouth every morning.     sodium bicarbonate 650 MG tablet  Take 650 mg by mouth 2 (two) times daily.     spironolactone 25 MG tablet  Commonly known as:   ALDACTONE  Take 1 tablet (25 mg total) by mouth daily.  Start taking on:  03/19/2013     Ticagrelor 90 MG Tabs tablet  Commonly known as:  BRILINTA  Take 1 tablet (90 mg total) by mouth 2 (two) times daily.           Follow-up Information   Follow up with Laverda Page, MD. (Keep previous appointment)    Specialty:  Cardiology   Contact information:   [place] [address] [phone]        Laverda Page, MD 03/18/2013, 8:42 AM  Pager: [phone] Office: [phone] If no answer: [phone]

## 2013-10-19 ENCOUNTER — Encounter: Payer: Medicare Other | Admitting: Thoracic Surgery (Cardiothoracic Vascular Surgery)

## 2013-10-23 ENCOUNTER — Other Ambulatory Visit: Payer: Self-pay | Admitting: Cardiology

## 2013-10-23 DIAGNOSIS — N184 Chronic kidney disease, stage 4 (severe): Secondary | ICD-10-CM

## 2013-11-05 ENCOUNTER — Ambulatory Visit
Admission: RE | Admit: 2013-11-05 | Discharge: 2013-11-05 | Disposition: A | Discharge status: Home / Self Care | Payer: Medicare Other | Source: Ambulatory Visit | Attending: Cardiology | Admitting: Cardiology

## 2013-11-05 DIAGNOSIS — N184 Chronic kidney disease, stage 4 (severe): Secondary | ICD-10-CM

## 2014-01-14 ENCOUNTER — Encounter (HOSPITAL_COMMUNITY): Payer: Self-pay | Admitting: Cardiology

## 2014-04-17 ENCOUNTER — Emergency Department (HOSPITAL_COMMUNITY)
Admission: EM | Admit: 2014-04-17 | Discharge: 2014-04-17 | Disposition: A | Discharge status: Home / Self Care | Payer: Medicare Other | Attending: Emergency Medicine | Admitting: Emergency Medicine

## 2014-04-17 ENCOUNTER — Encounter (HOSPITAL_COMMUNITY): Payer: Self-pay | Admitting: Emergency Medicine

## 2014-04-17 DIAGNOSIS — R109 Unspecified abdominal pain: Secondary | ICD-10-CM | POA: Diagnosis present

## 2014-04-17 DIAGNOSIS — R110 Nausea: Secondary | ICD-10-CM | POA: Diagnosis not present

## 2014-04-17 DIAGNOSIS — R011 Cardiac murmur, unspecified: Secondary | ICD-10-CM | POA: Insufficient documentation

## 2014-04-17 DIAGNOSIS — K219 Gastro-esophageal reflux disease without esophagitis: Secondary | ICD-10-CM | POA: Diagnosis not present

## 2014-04-17 DIAGNOSIS — Z8673 Personal history of transient ischemic attack (TIA), and cerebral infarction without residual deficits: Secondary | ICD-10-CM | POA: Diagnosis not present

## 2014-04-17 DIAGNOSIS — Z7982 Long term (current) use of aspirin: Secondary | ICD-10-CM | POA: Diagnosis not present

## 2014-04-17 DIAGNOSIS — F419 Anxiety disorder, unspecified: Secondary | ICD-10-CM | POA: Diagnosis not present

## 2014-04-17 DIAGNOSIS — E780 Pure hypercholesterolemia: Secondary | ICD-10-CM | POA: Insufficient documentation

## 2014-04-17 DIAGNOSIS — Z8739 Personal history of other diseases of the musculoskeletal system and connective tissue: Secondary | ICD-10-CM | POA: Insufficient documentation

## 2014-04-17 DIAGNOSIS — I129 Hypertensive chronic kidney disease with stage 1 through stage 4 chronic kidney disease, or unspecified chronic kidney disease: Secondary | ICD-10-CM | POA: Diagnosis not present

## 2014-04-17 DIAGNOSIS — N184 Chronic kidney disease, stage 4 (severe): Secondary | ICD-10-CM | POA: Diagnosis not present

## 2014-04-17 DIAGNOSIS — Z8719 Personal history of other diseases of the digestive system: Secondary | ICD-10-CM | POA: Diagnosis not present

## 2014-04-17 LAB — CBC WITH DIFFERENTIAL/PLATELET
BASOS PCT: 1 % (ref 0–1)
Basophils Absolute: 0 10*3/uL (ref 0.0–0.1)
Eosinophils Absolute: 0.2 10*3/uL (ref 0.0–0.7)
Eosinophils Relative: 3 % (ref 0–5)
HEMATOCRIT: 36.7 % — AB (ref 39.0–52.0)
HEMOGLOBIN: 12.1 g/dL — AB (ref 13.0–17.0)
Lymphocytes Relative: 16 % (ref 12–46)
Lymphs Abs: 0.9 10*3/uL (ref 0.7–4.0)
MCH: 30.6 pg (ref 26.0–34.0)
MCHC: 33 g/dL (ref 30.0–36.0)
MCV: 92.9 fL (ref 78.0–100.0)
MONO ABS: 0.6 10*3/uL (ref 0.1–1.0)
MONOS PCT: 11 % (ref 3–12)
NEUTROS PCT: 69 % (ref 43–77)
Neutro Abs: 3.9 10*3/uL (ref 1.7–7.7)
Platelets: 184 10*3/uL (ref 150–400)
RBC: 3.95 MIL/uL — AB (ref 4.22–5.81)
RDW: 13.9 % (ref 11.5–15.5)
WBC: 5.5 10*3/uL (ref 4.0–10.5)

## 2014-04-17 LAB — COMPREHENSIVE METABOLIC PANEL
ALT: 16 U/L (ref 0–53)
AST: 18 U/L (ref 0–37)
Albumin: 3.8 g/dL (ref 3.5–5.2)
Alkaline Phosphatase: 53 U/L (ref 39–117)
Anion gap: 13 (ref 5–15)
BUN: 47 mg/dL — ABNORMAL HIGH (ref 6–23)
CALCIUM: 8.7 mg/dL (ref 8.4–10.5)
CO2: 24 mmol/L (ref 19–32)
CREATININE: 5.77 mg/dL — AB (ref 0.50–1.35)
Chloride: 103 mmol/L (ref 96–112)
GFR calc Af Amer: 10 mL/min — ABNORMAL LOW (ref >90)
GFR, EST NON AFRICAN AMERICAN: 9 mL/min — AB (ref >90)
Glucose, Bld: 120 mg/dL — ABNORMAL HIGH (ref 70–99)
Potassium: 3.4 mmol/L — ABNORMAL LOW (ref 3.5–5.1)
SODIUM: 140 mmol/L (ref 135–145)
TOTAL PROTEIN: 7.1 g/dL (ref 6.0–8.3)
Total Bilirubin: 1.1 mg/dL (ref 0.3–1.2)

## 2014-04-17 LAB — LIPASE, BLOOD: LIPASE: 24 U/L (ref 11–59)

## 2014-04-17 MED ORDER — ONDANSETRON 8 MG PO TBDP: ORAL_TABLET | ORAL | Status: DC

## 2014-04-17 MED ORDER — ONDANSETRON 8 MG PO TBDP
8.0000 mg | ORAL_TABLET | Freq: Once | ORAL | Status: AC
  Administered 2014-04-17: 8 mg via ORAL
  Filled 2014-04-17: qty 1

## 2014-04-17 NOTE — Discharge Instructions (Signed)
Tests were good. Medication for nausea. Go to dialysis now.

## 2014-04-17 NOTE — ED Provider Notes (Signed)
CSN: [id number]     Arrival date & time 04/17/14  4098 History  This chart was scribed for Nat Christen, MD by Peyton Bottoms, ED Scribe. This patient was seen in room APA19/APA19 and the patient's care was started at 7:32 AM.    Chief Complaint  Patient presents with  . Abdominal Pain   Patient is a 69 y.o. male presenting with abdominal pain. The history is provided by the patient. No language interpreter was used.  Abdominal Pain  LEVEL 5 CAVEAT: FOR SLIGHT MENTAL RETARDATION  HPI Comments: Charles Zuniga is a 69 y.o. male with a PMHx of hypertension, diverticulitis, gout, GERD, heart murmur, high cholesterol, stroke, anxiety, renal disorder, CKD, kidney surgery, AV fistula placement, L heart catheterization, and coronary angioplasty with stent placement, who presents to the Emergency Department complaining of moderate nausea with associated epigastric abdominal pain that began 5 days ago. He reports also associated multiple episodes of emesis and hives per day Patient states he was seen by PCP Dr.Hawkins 2 days ago and was given Zofran medication. He also reports associated diarrhea. Per sister, patient has dialysis treatments 3 days per week. He has not been able to go for dialysis treatment for the past 2 appointments. Sister states that she does not feel like patient is in distress. She states that patient becomes anxious regarding his health.  Past Medical History  Diagnosis Date  . Hypertension   . Gout     occasional  . Diverticulitis   . Mass on back     "fatty tumor"  . GERD (gastroesophageal reflux disease)   . Heart murmur   . High cholesterol   . History of stomach ulcers     "once"  . Stroke 1980's?    denies residual on 03/17/2013  . Anxiety   . Renal disorder   . CKD (chronic kidney disease) stage 4, GFR 15-29 ml/min     "fistula ready; not on dialysis yet" (03/17/2013)   Past Surgical History  Procedure Laterality Date  . Kidney surgery  1980's    "took growth  off it" (2/10/2015_  . Av fistula placement Left 11/2011    upper arm  . Colonoscopy with esophagogastroduodenoscopy (egd) N/A 05/27/2012    Procedure: COLONOSCOPY WITH ESOPHAGOGASTRODUODENOSCOPY (EGD);  Surgeon: Rogene Houston, MD;  Location: [facility] SUITE;  Service: Endoscopy;  Laterality: N/A;  100-moved to 115 Ann to notify pt  . Coronary angioplasty with stent placement  03/17/2013    "1"  . Left heart catheterization with coronary angiogram N/A 03/17/2013    Procedure: LEFT HEART CATHETERIZATION WITH CORONARY ANGIOGRAM;  Surgeon: Laverda Page, MD;  Location: [facility] CATH LAB;  Service: Cardiovascular;  Laterality: N/A;   History reviewed. No pertinent family history. History  Substance Use Topics  . Smoking status: Never Smoker   . Smokeless tobacco: Never Used  . Alcohol Use: No   Review of Systems  Gastrointestinal: Positive for abdominal pain.   Level 5 Caveat: slight mental retardation  Allergies  Clonidine derivatives  Home Medications   Prior to Admission medications   Medication Sig Start Date End Date Taking? Authorizing Provider  acetaminophen (TYLENOL) 500 MG tablet Take 500 mg by mouth every 6 (six) hours as needed for moderate pain.     Historical Provider, MD  aspirin EC 81 MG tablet Take 81 mg by mouth daily.    Historical Provider, MD  atorvastatin (LIPITOR) 40 MG tablet Take 40 mg by mouth daily.  05/06/12  Historical Provider, MD  furosemide (LASIX) 40 MG tablet Take 40 mg by mouth daily.    Historical Provider, MD  isosorbide mononitrate (IMDUR) 120 MG 24 hr tablet Take 120 mg by mouth daily.    Historical Provider, MD  losartan (COZAAR) 100 MG tablet Take 1 tablet (100 mg total) by mouth daily. 03/19/13   Adrian Prows, MD  metoprolol succinate (TOPROL-XL) 50 MG 24 hr tablet Take 50 mg by mouth daily. Take with or immediately following a meal.    Historical Provider, MD  nitroGLYCERIN (NITROSTAT) 0.4 MG SL tablet Place 1 tablet (0.4 mg total) under the tongue now.  03/18/13   Adrian Prows, MD  ondansetron (ZOFRAN ODT) 8 MG disintegrating tablet 8mg  ODT q4 hours prn nausea 04/17/14   Nat Christen, MD  pantoprazole (PROTONIX) 40 MG tablet Take 40 mg by mouth every morning.    Historical Provider, MD  sodium bicarbonate 650 MG tablet Take 650 mg by mouth 2 (two) times daily.    Historical Provider, MD  spironolactone (ALDACTONE) 25 MG tablet Take 1 tablet (25 mg total) by mouth daily. 03/19/13   Adrian Prows, MD  Ticagrelor (BRILINTA) 90 MG TABS tablet Take 1 tablet (90 mg total) by mouth 2 (two) times daily. 03/18/13   Adrian Prows, MD   Triage Vitals: BP 151/116 mmHg  Pulse 58  Temp(Src) 97.9 F (36.6 C) (Oral)  Resp 22  Ht 6' (1.829 m)  Wt 148 lb (67.132 kg)  BMI 20.07 kg/m2  SpO2 97%  Physical Exam  Constitutional: He is oriented to person, place, and time. He appears well-developed and well-nourished.  HENT:  Head: Normocephalic and atraumatic.  Eyes: Conjunctivae and EOM are normal. Pupils are equal, round, and reactive to light.  Neck: Normal range of motion. Neck supple.  Cardiovascular: Normal rate and regular rhythm.   Pulmonary/Chest: Effort normal and breath sounds normal.  Abdominal: Soft. Bowel sounds are normal.  Musculoskeletal: Normal range of motion.  Neurological: He is alert and oriented to person, place, and time.  Skin: Skin is warm and dry.  Psychiatric: He has a normal mood and affect. His behavior is normal.  Nursing note and vitals reviewed.  ED Course  Procedures (including critical care time)  DIAGNOSTIC STUDIES: Oxygen Saturation is 97% on RA, normal by my interpretation.    COORDINATION OF CARE: 7:35 AM- Discussed plans to order diagnostic lab work. Advised patient to go get his dialysis treatment today. Pt advised of plan for treatment and pt agrees.  Labs Review Labs Reviewed  CBC WITH DIFFERENTIAL/PLATELET - Abnormal; Notable for the following:    RBC 3.95 (*)    Hemoglobin 12.1 (*)    HCT 36.7 (*)    All other  components within normal limits  COMPREHENSIVE METABOLIC PANEL - Abnormal; Notable for the following:    Potassium 3.4 (*)    Glucose, Bld 120 (*)    BUN 47 (*)    Creatinine, Ser 5.77 (*)    GFR calc non Af Amer 9 (*)    GFR calc Af Amer 10 (*)    All other components within normal limits  LIPASE, BLOOD    Imaging Review No results found.   EKG Interpretation None     MDM   Final diagnoses:  Nausea    No acute abdomen. White count normal. Screening labs normal with the exception of creatinine elevated. Patient has end-stage renal disease. Discharge medications Zofran 8 mg ODT. We'll go to dialysis now.  I personally  performed the services described in this documentation, which was scribed in my presence. The recorded information has been reviewed and is accurate.   Nat Christen, MD 04/17/14 [phone]

## 2014-04-17 NOTE — ED Notes (Signed)
Patient c/o mid to left lower abd pain since Tuesday with nausea, vomiting. Diarrhea, and "bad taste in mouth." Per patient possible fever Tuesday but none since then. Patient reports being seen by PCP Tuesday and told viral infection, given zofran with no relief. Per patient hx of diverticulitis in which he states this feels like. Patient is a dialysis patient, missed dialysis Thursday and is supposed to go today.

## 2014-12-28 IMAGING — CT CT ABD-PELV W/O CM
2 of 4 series · 16 of 46 positions shown, 18 images · non-contrast
Comparison: 07/27/2011

CLINICAL DATA: Abdominal pain and rectal bleeding.

CT ABDOMEN AND PELVIS WITHOUT CONTRAST
TECHNIQUE: Multidetector CT imaging of the abdomen and pelvis was
performed following the standard protocol without intravenous
contrast.

[Series 2: abdomen/pelvis w/o contrast · axial · non-contrast · 0.73mm/px · z∈[-446,-26]mm · 13 of 98 slices shown, 15 images]
[im 7/98  soft-tissue]
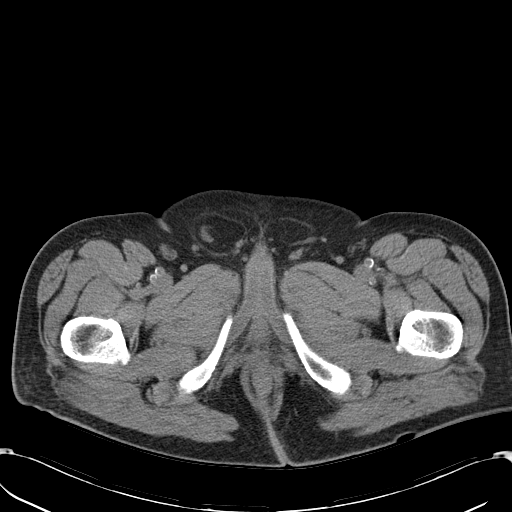
[im 7/98  bone]
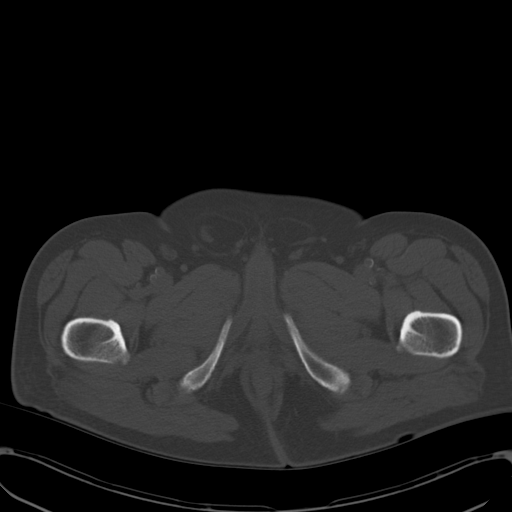
[im 13/98  soft-tissue]
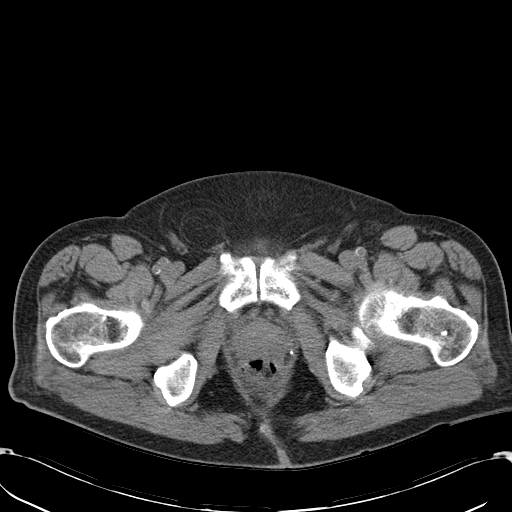
[im 20/98  soft-tissue]
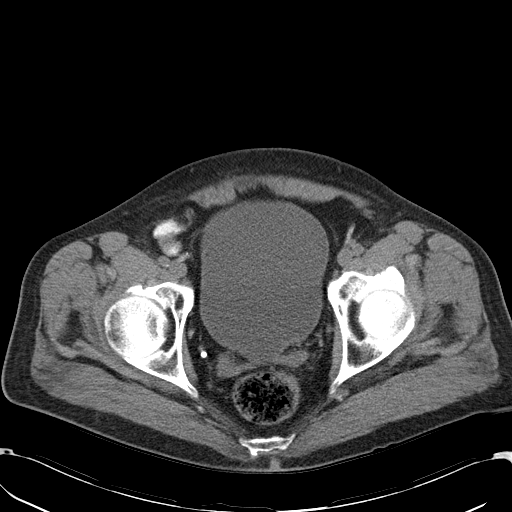
[im 26/98  soft-tissue]
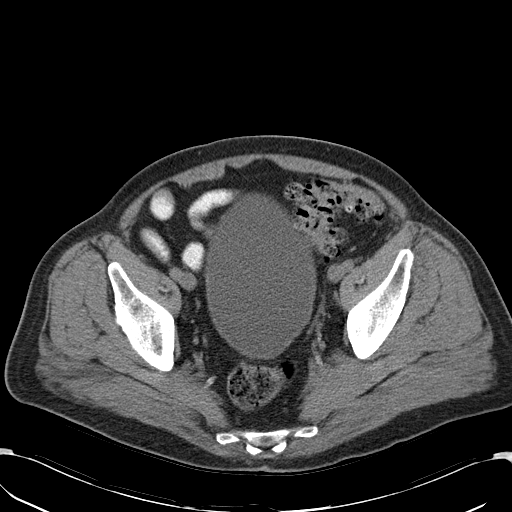
[im 33/98  soft-tissue]
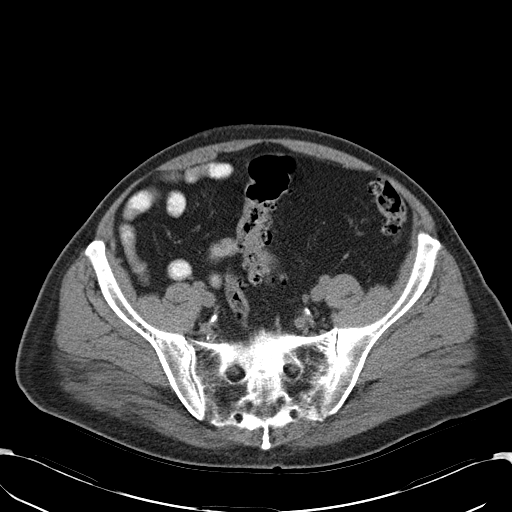
[im 39/98  soft-tissue]
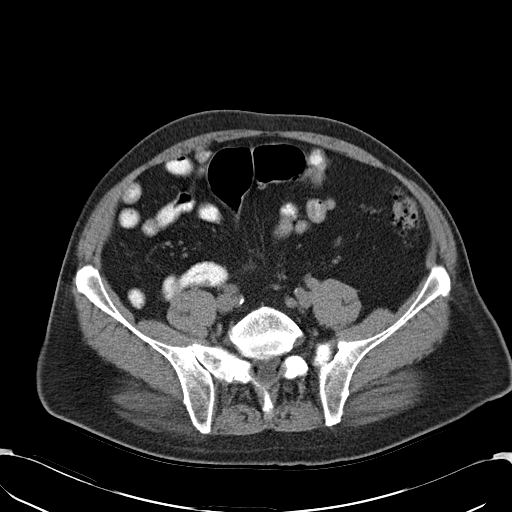
[im 52/98  soft-tissue]
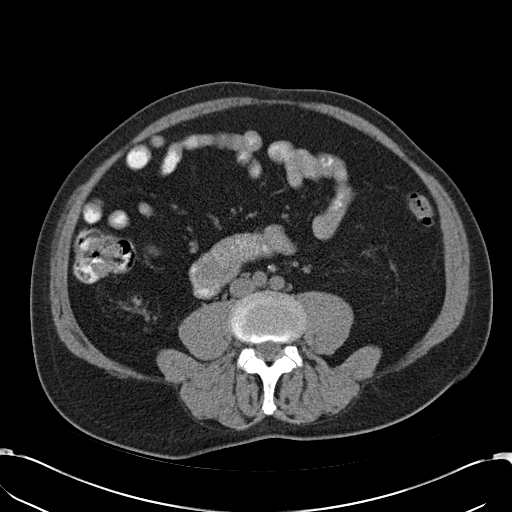
[im 59/98  soft-tissue]
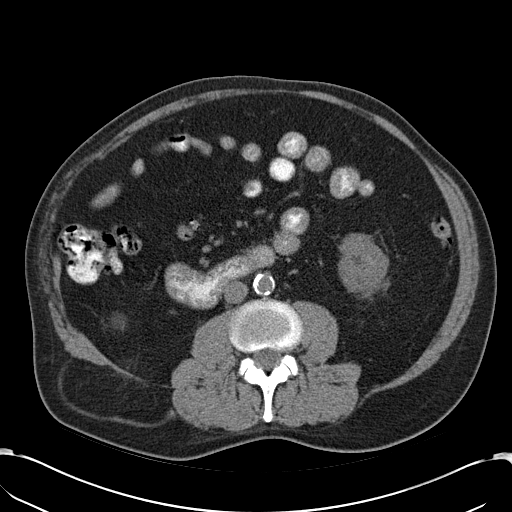
[im 65/98  soft-tissue]
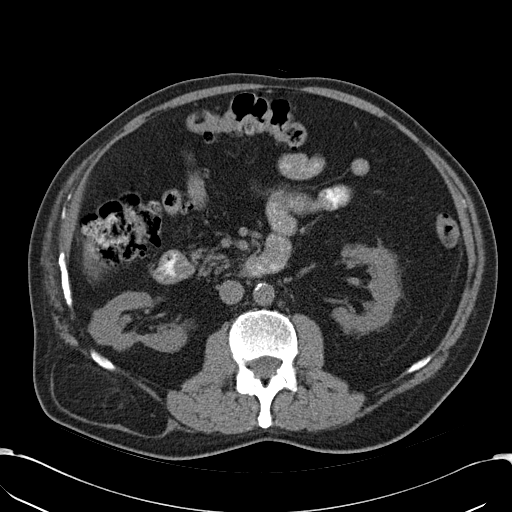
[im 65/98  bone]
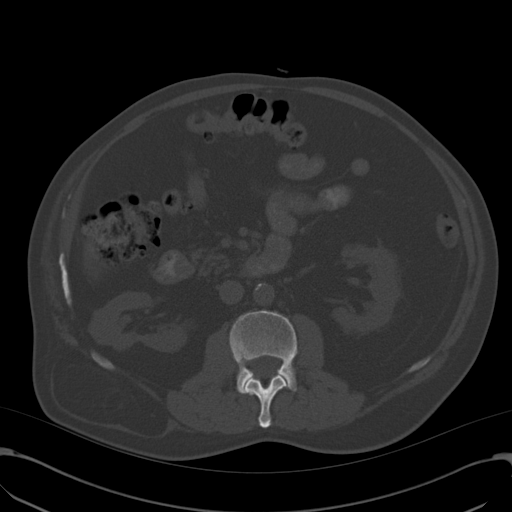
[im 72/98  soft-tissue]
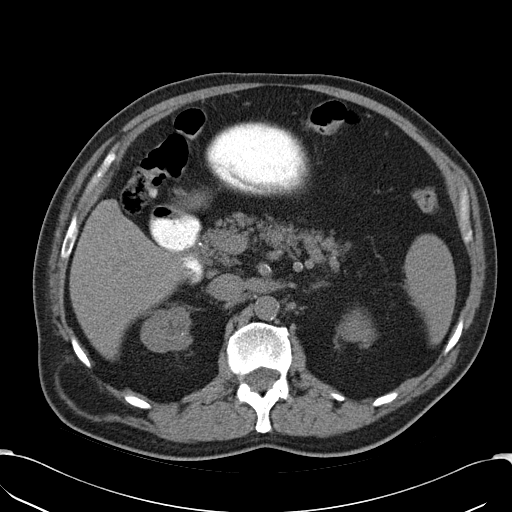
[im 78/98  soft-tissue]
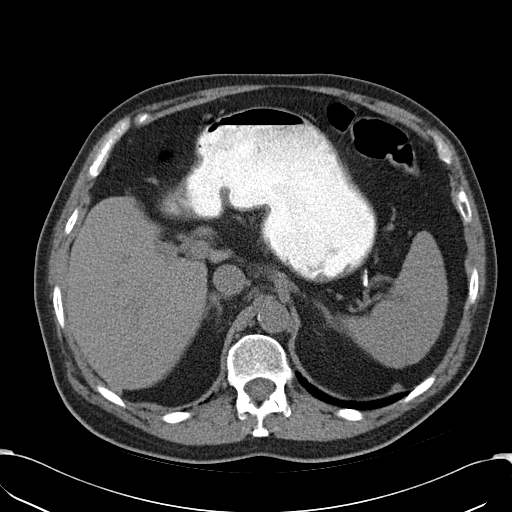
[im 85/98  soft-tissue]
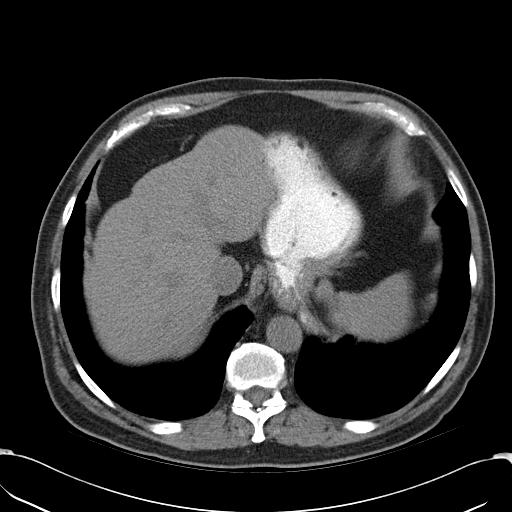
[im 91/98  soft-tissue]
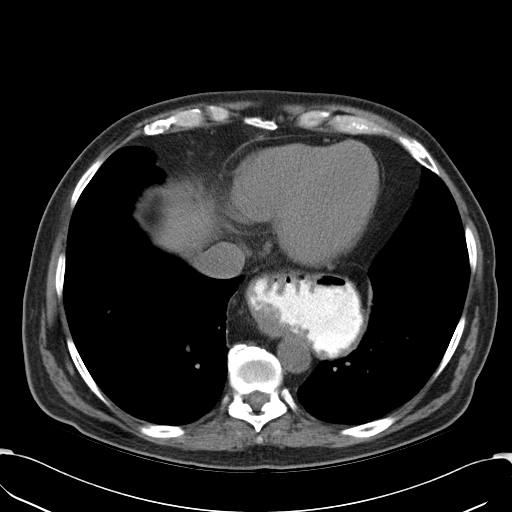

[Series 4: mpr cor (id) · coronal · 0.85mm/px · 3 of 87 slices shown]
[im 29/87  soft-tissue]
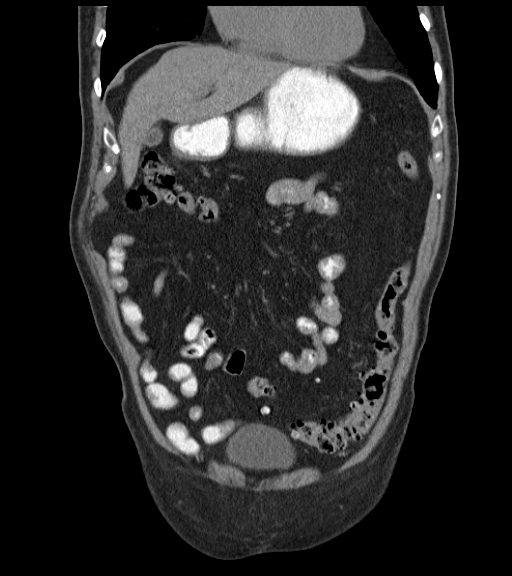
[im 39/87  soft-tissue]
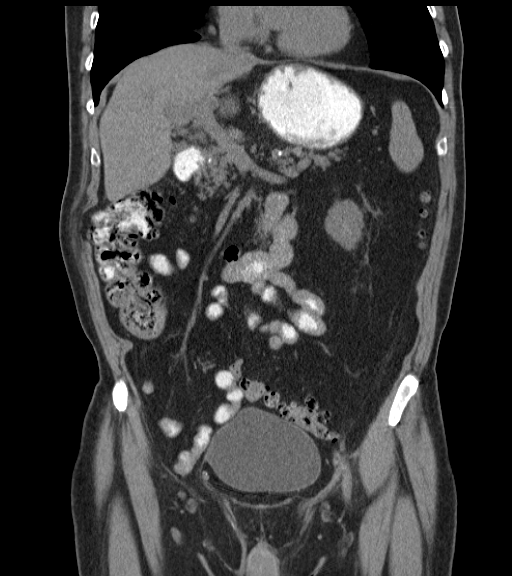
[im 48/87  soft-tissue]
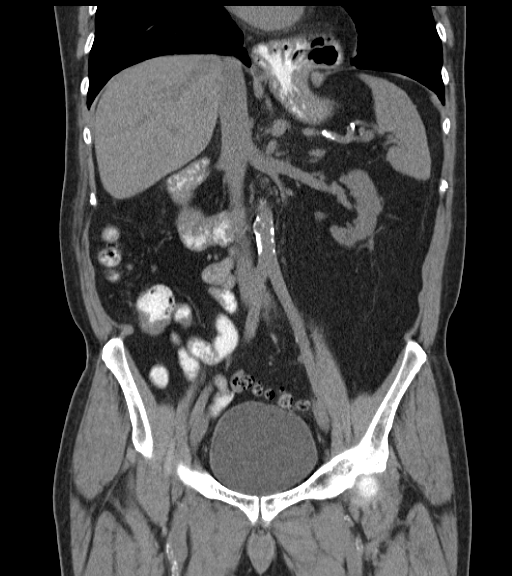

[16 of 46 positions shown; findings below may reference images not displayed]

FINDINGS: A moderate to large hiatal hernia is evident.  Within the
proximal stomach, there is a 9 mm soft tissue nodule which could be
a gastric polyp.

The liver, the liver and spleen have normal uninfused features.
Duodenum, pancreas, adrenal glands are unremarkable.  Gallbladder
is decompressed.

The kidneys are irregular and scarred bilaterally.  Cystic changes
seen in the upper pole of the right kidney which is slightly
progressed and could represent some right upper pole calyceal
dilatation or central sinus cyst in the upper pole of the right
kidney.

No abdominal aortic aneurysm.  There is no free fluid or
lymphadenopathy in the abdomen.

Imaging through the pelvis shows a bladder distention, as before.
No intraperitoneal free fluid.  No pelvic sidewall lymphadenopathy.

Diffuse diverticular changes seen in the colon without evidence for
diverticulitis.  The terminal ileum is normal. The appendix is not
visualized, but there is no edema or inflammation in the region of
the cecum.

A right inguinal hernia contains only fat.  A right Petit's hernia
is stable and contains only fat.

Bone windows reveal no worrisome lytic or sclerotic osseous
lesions.
IMPRESSION: No acute findings.

Question 9 mm gastric polyp in the proximal stomach this is contain
with and the moderate-to-large hiatal hernia.  In retrospect, this
finding was present on the previous study and is not substantially
changed.

Bilateral renal cortical scarring with central sinus cysts or
obstructed calyces in the upper pole of the right kidney.

Distended bladder raises the question of bladder outlet
obstruction.

Colonic diverticulosis without diverticulitis.

Right inguinal and Petit's hernias.

## 2014-12-31 ENCOUNTER — Emergency Department (HOSPITAL_COMMUNITY): Payer: Medicare Other

## 2014-12-31 ENCOUNTER — Inpatient Hospital Stay (HOSPITAL_COMMUNITY)
Admission: EM | Admit: 2014-12-31 | Discharge: 2015-01-06 | DRG: 871 | Disposition: E | Payer: Medicare Other | Attending: Internal Medicine | Admitting: Internal Medicine

## 2014-12-31 ENCOUNTER — Inpatient Hospital Stay (HOSPITAL_COMMUNITY): Payer: Medicare Other

## 2014-12-31 DIAGNOSIS — I447 Left bundle-branch block, unspecified: Secondary | ICD-10-CM | POA: Diagnosis present

## 2014-12-31 DIAGNOSIS — I2510 Atherosclerotic heart disease of native coronary artery without angina pectoris: Secondary | ICD-10-CM | POA: Diagnosis present

## 2014-12-31 DIAGNOSIS — E874 Mixed disorder of acid-base balance: Secondary | ICD-10-CM | POA: Diagnosis present

## 2014-12-31 DIAGNOSIS — N2581 Secondary hyperparathyroidism of renal origin: Secondary | ICD-10-CM | POA: Diagnosis present

## 2014-12-31 DIAGNOSIS — K219 Gastro-esophageal reflux disease without esophagitis: Secondary | ICD-10-CM | POA: Diagnosis present

## 2014-12-31 DIAGNOSIS — Z79899 Other long term (current) drug therapy: Secondary | ICD-10-CM | POA: Diagnosis not present

## 2014-12-31 DIAGNOSIS — F419 Anxiety disorder, unspecified: Secondary | ICD-10-CM | POA: Diagnosis present

## 2014-12-31 DIAGNOSIS — I2584 Coronary atherosclerosis due to calcified coronary lesion: Secondary | ICD-10-CM | POA: Diagnosis present

## 2014-12-31 DIAGNOSIS — I272 Other secondary pulmonary hypertension: Secondary | ICD-10-CM | POA: Diagnosis present

## 2014-12-31 DIAGNOSIS — Z992 Dependence on renal dialysis: Secondary | ICD-10-CM

## 2014-12-31 DIAGNOSIS — K7200 Acute and subacute hepatic failure without coma: Secondary | ICD-10-CM | POA: Diagnosis present

## 2014-12-31 DIAGNOSIS — E875 Hyperkalemia: Secondary | ICD-10-CM | POA: Diagnosis not present

## 2014-12-31 DIAGNOSIS — Z7902 Long term (current) use of antithrombotics/antiplatelets: Secondary | ICD-10-CM

## 2014-12-31 DIAGNOSIS — Z955 Presence of coronary angioplasty implant and graft: Secondary | ICD-10-CM | POA: Diagnosis not present

## 2014-12-31 DIAGNOSIS — I472 Ventricular tachycardia: Secondary | ICD-10-CM | POA: Diagnosis present

## 2014-12-31 DIAGNOSIS — J9600 Acute respiratory failure, unspecified whether with hypoxia or hypercapnia: Secondary | ICD-10-CM | POA: Diagnosis not present

## 2014-12-31 DIAGNOSIS — E785 Hyperlipidemia, unspecified: Secondary | ICD-10-CM | POA: Diagnosis present

## 2014-12-31 DIAGNOSIS — A419 Sepsis, unspecified organism: Secondary | ICD-10-CM | POA: Diagnosis present

## 2014-12-31 DIAGNOSIS — Z9181 History of falling: Secondary | ICD-10-CM

## 2014-12-31 DIAGNOSIS — R570 Cardiogenic shock: Secondary | ICD-10-CM | POA: Diagnosis present

## 2014-12-31 DIAGNOSIS — J9601 Acute respiratory failure with hypoxia: Secondary | ICD-10-CM | POA: Insufficient documentation

## 2014-12-31 DIAGNOSIS — D631 Anemia in chronic kidney disease: Secondary | ICD-10-CM | POA: Diagnosis present

## 2014-12-31 DIAGNOSIS — M898X9 Other specified disorders of bone, unspecified site: Secondary | ICD-10-CM | POA: Diagnosis present

## 2014-12-31 DIAGNOSIS — I120 Hypertensive chronic kidney disease with stage 5 chronic kidney disease or end stage renal disease: Secondary | ICD-10-CM | POA: Diagnosis present

## 2014-12-31 DIAGNOSIS — Z7982 Long term (current) use of aspirin: Secondary | ICD-10-CM

## 2014-12-31 DIAGNOSIS — I350 Nonrheumatic aortic (valve) stenosis: Secondary | ICD-10-CM | POA: Diagnosis present

## 2014-12-31 DIAGNOSIS — N186 End stage renal disease: Secondary | ICD-10-CM | POA: Diagnosis present

## 2014-12-31 DIAGNOSIS — J189 Pneumonia, unspecified organism: Secondary | ICD-10-CM | POA: Diagnosis present

## 2014-12-31 DIAGNOSIS — R6521 Severe sepsis with septic shock: Secondary | ICD-10-CM | POA: Diagnosis present

## 2014-12-31 DIAGNOSIS — I214 Non-ST elevation (NSTEMI) myocardial infarction: Secondary | ICD-10-CM | POA: Diagnosis present

## 2014-12-31 DIAGNOSIS — Z8673 Personal history of transient ischemic attack (TIA), and cerebral infarction without residual deficits: Secondary | ICD-10-CM | POA: Diagnosis not present

## 2014-12-31 DIAGNOSIS — J9690 Respiratory failure, unspecified, unspecified whether with hypoxia or hypercapnia: Secondary | ICD-10-CM | POA: Insufficient documentation

## 2014-12-31 DIAGNOSIS — R531 Weakness: Secondary | ICD-10-CM | POA: Diagnosis present

## 2014-12-31 LAB — I-STAT CG4 LACTIC ACID, ED: Lactic Acid, Venous: 3.74 mmol/L (ref 0.5–2.0)

## 2014-12-31 LAB — CBC WITH DIFFERENTIAL/PLATELET
BASOS PCT: 0 %
Basophils Absolute: 0.1 10*3/uL (ref 0.0–0.1)
Eosinophils Absolute: 0 10*3/uL (ref 0.0–0.7)
Eosinophils Relative: 0 %
HCT: 38.3 % — ABNORMAL LOW (ref 39.0–52.0)
HEMOGLOBIN: 12.9 g/dL — AB (ref 13.0–17.0)
LYMPHS PCT: 3 %
Lymphs Abs: 0.5 10*3/uL — ABNORMAL LOW (ref 0.7–4.0)
MCH: 32.6 pg (ref 26.0–34.0)
MCHC: 33.7 g/dL (ref 30.0–36.0)
MCV: 96.7 fL (ref 78.0–100.0)
MONOS PCT: 10 %
Monocytes Absolute: 1.8 10*3/uL — ABNORMAL HIGH (ref 0.1–1.0)
NEUTROS ABS: 16.2 10*3/uL — AB (ref 1.7–7.7)
NEUTROS PCT: 87 %
Platelets: 319 10*3/uL (ref 150–400)
RBC: 3.96 MIL/uL — ABNORMAL LOW (ref 4.22–5.81)
RDW: 16.2 % — ABNORMAL HIGH (ref 11.5–15.5)
WBC: 18.5 10*3/uL — AB (ref 4.0–10.5)

## 2014-12-31 LAB — BLOOD GAS, ARTERIAL
Acid-Base Excess: 2 mmol/L (ref 0.0–2.0)
Acid-base deficit: 3.7 mmol/L — ABNORMAL HIGH (ref 0.0–2.0)
Bicarbonate: 27 mEq/L — ABNORMAL HIGH (ref 20.0–24.0)
Bicarbonate: 21.7 mEq/L (ref 20.0–24.0)
DRAWN BY: 105551
Drawn by: 105551
FIO2: 100
FIO2: 100
O2 SAT: 94.3 %
O2 SAT: 97 %
PCO2 ART: 34.7 mmHg — AB (ref 35.0–45.0)
PEEP: 12 cmH2O
Patient temperature: 98.6
RATE: 24 resp/min
VT: 480 mL
pCO2 arterial: 29.5 mmHg — ABNORMAL LOW (ref 35.0–45.0)
pH, Arterial: 7.531 — ABNORMAL HIGH (ref 7.350–7.450)
pH, Arterial: 7.388 (ref 7.350–7.450)
pO2, Arterial: 70.6 mmHg — ABNORMAL LOW (ref 80.0–100.0)
pO2, Arterial: 111 mmHg — ABNORMAL HIGH (ref 80.0–100.0)

## 2014-12-31 LAB — BASIC METABOLIC PANEL
Anion gap: 16 — ABNORMAL HIGH (ref 5–15)
BUN: 22 mg/dL — ABNORMAL HIGH (ref 6–20)
CO2: 25 mmol/L (ref 22–32)
Calcium: 8.9 mg/dL (ref 8.9–10.3)
Chloride: 97 mmol/L — ABNORMAL LOW (ref 101–111)
Creatinine, Ser: 5.05 mg/dL — ABNORMAL HIGH (ref 0.61–1.24)
GFR calc non Af Amer: 11 mL/min — ABNORMAL LOW (ref >60)
GFR, EST AFRICAN AMERICAN: 12 mL/min — AB (ref >60)
Glucose, Bld: 141 mg/dL — ABNORMAL HIGH (ref 65–99)
Potassium: 4.3 mmol/L (ref 3.5–5.1)
Sodium: 138 mmol/L (ref 135–145)

## 2014-12-31 LAB — I-STAT CHEM 8, ED
BUN: 21 mg/dL — AB (ref 6–20)
Calcium, Ion: 0.97 mmol/L — ABNORMAL LOW (ref 1.13–1.30)
Chloride: 98 mmol/L — ABNORMAL LOW (ref 101–111)
Creatinine, Ser: 5 mg/dL — ABNORMAL HIGH (ref 0.61–1.24)
Glucose, Bld: 142 mg/dL — ABNORMAL HIGH (ref 65–99)
HEMATOCRIT: 40 % (ref 39.0–52.0)
Hemoglobin: 13.6 g/dL (ref 13.0–17.0)
Potassium: 4.2 mmol/L (ref 3.5–5.1)
SODIUM: 139 mmol/L (ref 135–145)
TCO2: 24 mmol/L (ref 0–100)

## 2014-12-31 LAB — TROPONIN I: Troponin I: 6.6 ng/mL (ref <0.031)

## 2014-12-31 MED ORDER — VANCOMYCIN HCL IN DEXTROSE 1-5 GM/200ML-% IV SOLN
1000.0000 mg | Freq: Once | INTRAVENOUS | Status: AC
  Administered 2014-12-31: 1000 mg via INTRAVENOUS
  Filled 2014-12-31: qty 200

## 2014-12-31 MED ORDER — MAGNESIUM SULFATE 2 GM/50ML IV SOLN
2.0000 g | Freq: Once | INTRAVENOUS | Status: AC
  Administered 2015-01-01: 2 g via INTRAVENOUS
  Filled 2014-12-31: qty 50

## 2014-12-31 MED ORDER — HEPARIN (PORCINE) IN NACL 100-0.45 UNIT/ML-% IJ SOLN
950.0000 [IU]/h | INTRAMUSCULAR | Status: DC
  Administered 2014-12-31: 950 [IU]/h via INTRAVENOUS
  Filled 2014-12-31: qty 250

## 2014-12-31 MED ORDER — ASPIRIN 81 MG PO CHEW: 324.0000 mg | CHEWABLE_TABLET | Freq: Once | ORAL | Status: DC

## 2014-12-31 MED ORDER — SODIUM CHLORIDE 0.9 % IJ SOLN
INTRAMUSCULAR | Status: AC
  Filled 2014-12-31: qty 60

## 2014-12-31 MED ORDER — ASPIRIN 81 MG PO CHEW
324.0000 mg | CHEWABLE_TABLET | Freq: Once | ORAL | Status: AC
  Administered 2014-12-31: 324 mg via ORAL
  Filled 2014-12-31: qty 4

## 2014-12-31 MED ORDER — SODIUM CHLORIDE 0.9 % IV BOLUS (SEPSIS)
1000.0000 mL | Freq: Once | INTRAVENOUS | Status: AC
  Administered 2014-12-31: 1000 mL via INTRAVENOUS

## 2014-12-31 MED ORDER — SODIUM CHLORIDE 0.9 % IV BOLUS (SEPSIS): 500.0000 mL | Freq: Once | INTRAVENOUS | Status: AC

## 2014-12-31 MED ORDER — PROPOFOL 1000 MG/100ML IV EMUL
5.0000 ug/kg/min | INTRAVENOUS | Status: DC
  Administered 2014-12-31: 10 ug/kg/min via INTRAVENOUS
  Filled 2014-12-31: qty 100

## 2014-12-31 MED ORDER — ETOMIDATE 2 MG/ML IV SOLN
20.0000 mg | Freq: Once | INTRAVENOUS | Status: AC
  Administered 2014-12-31: 20 mg via INTRAVENOUS

## 2014-12-31 MED ORDER — IOHEXOL 350 MG/ML SOLN
100.0000 mL | Freq: Once | INTRAVENOUS | Status: AC | PRN
  Administered 2014-12-31: 100 mL via INTRAVENOUS

## 2014-12-31 MED ORDER — PIPERACILLIN-TAZOBACTAM 3.375 G IVPB
3.3750 g | Freq: Once | INTRAVENOUS | Status: AC
  Administered 2014-12-31: 3.375 g via INTRAVENOUS
  Filled 2014-12-31: qty 50

## 2014-12-31 MED ORDER — HEPARIN BOLUS VIA INFUSION
4000.0000 [IU] | Freq: Once | INTRAVENOUS | Status: AC
  Administered 2014-12-31: 4000 [IU] via INTRAVENOUS

## 2014-12-31 MED ORDER — ROCURONIUM BROMIDE 50 MG/5ML IV SOLN
100.0000 mg | Freq: Once | INTRAVENOUS | Status: AC
  Administered 2014-12-31: 100 mg via INTRAVENOUS

## 2014-12-31 NOTE — ED Notes (Addendum)
Pt had a run of v tach for a few seconds, EDP notified & in room to see pt.  Pt noted sweating profusely EDP aware.

## 2014-12-31 NOTE — ED Provider Notes (Addendum)
CSN: [id number]     Arrival date & time 12/10/2014  2027 History  By signing my name below, I, Hansel Feinstein, attest that this documentation has been prepared under the direction and in the presence of Ezequiel Essex, MD. Electronically Signed: Hansel Feinstein, ED Scribe. 12/27/2014. 8:58 PM.    Chief Complaint  Patient presents with  . Hypotension  . Nausea   The history is provided by the patient. No language interpreter was used.    HPI Comments: Charles Zuniga is a 69 y.o. male BIBA with h/o HTN, GERD, HLD, stroke, renal disorder, CKD, one cardiac stent who presents to the Emergency Department s/p fall just PTA. Pt states he got home from dialysis this evening and felt generally weak, falling on his back. No LOC or head injury. Per sister, she called EMS after she went to check on the pt and he could not get up. Pt states he completed  his dialysis today at [facility] and began to feel lightheaded and nauseated. He also reports 3 episodes of emesis, decreased appetite and thirst, SOB, productive cough, left costal pain which has been constant since yesterday. He states that pain worsened with deep inspiration. He reports no SOB during dialysis and a normal amount of fluid was removed. He took 1 NTG PTA. He notes that he has been on dialysis for one year and has not missed any appointments. He states that he still makes urine. Pt states his left costal pain is not similar to his prior pain with cardiac hx and reports no hx of similar. No h/o emphysema, PE/DVT, asthma, COPD. Pt is not O2 dependent at home. Pt is on qd 81mg  ASA.  Pt is a non-smoker, never smoker. He denies abdominal pain, fever, CP.    Past Medical History  Diagnosis Date  . Hypertension   . Gout     occasional  . Diverticulitis   . Mass on back     "fatty tumor"  . GERD (gastroesophageal reflux disease)   . Heart murmur   . High cholesterol   . History of stomach ulcers     "once"  . Stroke 1980's?    denies residual on 03/17/2013  .  Anxiety   . Renal disorder   . CKD (chronic kidney disease) stage 4, GFR 15-29 ml/min     "fistula ready; not on dialysis yet" (03/17/2013)   Past Surgical History  Procedure Laterality Date  . Kidney surgery  1980's    "took growth off it" (2/10/2015_  . Av fistula placement Left 11/2011    upper arm  . Colonoscopy with esophagogastroduodenoscopy (egd) N/A 05/27/2012    Procedure: COLONOSCOPY WITH ESOPHAGOGASTRODUODENOSCOPY (EGD);  Surgeon: Rogene Houston, MD;  Location: [facility]O SUITE;  Service: Endoscopy;  Laterality: N/A;  100-moved to 115 Ann to notify pt  . Coronary angioplasty with stent placement  03/17/2013    "1"  . Left heart catheterization with coronary angiogram N/A 03/17/2013    Procedure: LEFT HEART CATHETERIZATION WITH CORONARY ANGIOGRAM;  Surgeon: Laverda Page, MD;  Location: [facility] CATH LAB;  Service: Cardiovascular;  Laterality: N/A;   No family history on file. Social History  Substance Use Topics  . Smoking status: Never Smoker   . Smokeless tobacco: Never Used  . Alcohol Use: No    Review of Systems A complete 10 system review of systems was obtained and all systems are negative except as noted in the HPI and PMH.    Allergies  Clonidine derivatives  Home Medications   Prior to Admission medications   Medication Sig Start Date End Date Taking? Authorizing Provider  acetaminophen (TYLENOL) 500 MG tablet Take 500 mg by mouth every 6 (six) hours as needed for moderate pain.     Historical Provider, MD  amLODipine (NORVASC) 10 MG tablet  09/29/14   Historical Provider, MD  aspirin EC 81 MG tablet Take 81 mg by mouth daily.    Historical Provider, MD  atorvastatin (LIPITOR) 40 MG tablet Take 40 mg by mouth daily.  05/06/12   Historical Provider, MD  clopidogrel (PLAVIX) 75 MG tablet  10/25/14   Historical Provider, MD  furosemide (LASIX) 40 MG tablet Take 40 mg by mouth daily.    Historical Provider, MD  hydrALAZINE (APRESOLINE) 25 MG tablet  12/29/14   Historical  Provider, MD  isosorbide mononitrate (IMDUR) 120 MG 24 hr tablet Take 120 mg by mouth daily.    Historical Provider, MD  losartan (COZAAR) 100 MG tablet Take 1 tablet (100 mg total) by mouth daily. 03/19/13   Adrian Prows, MD  metoprolol succinate (TOPROL-XL) 100 MG 24 hr tablet  10/25/14   Historical Provider, MD  metoprolol succinate (TOPROL-XL) 50 MG 24 hr tablet Take 50 mg by mouth daily. Take with or immediately following a meal.    Historical Provider, MD  nitroGLYCERIN (NITROSTAT) 0.4 MG SL tablet Place 1 tablet (0.4 mg total) under the tongue now. 03/18/13   Adrian Prows, MD  ondansetron (ZOFRAN ODT) 8 MG disintegrating tablet 8mg  ODT q4 hours prn nausea 04/17/14   Nat Christen, MD  pantoprazole (PROTONIX) 40 MG tablet Take 40 mg by mouth every morning.    Historical Provider, MD  sodium bicarbonate 650 MG tablet Take 650 mg by mouth 2 (two) times daily.    Historical Provider, MD  spironolactone (ALDACTONE) 25 MG tablet Take 1 tablet (25 mg total) by mouth daily. 03/19/13   Adrian Prows, MD  Ticagrelor (BRILINTA) 90 MG TABS tablet Take 1 tablet (90 mg total) by mouth 2 (two) times daily. 03/18/13   Adrian Prows, MD   BP 111/79 mmHg  Pulse 88  Temp(Src) 98.8 F (37.1 C) (Oral)  Resp 24  Ht 5\' 9"  (1.753 m)  Wt 150 lb (68.04 kg)  BMI 22.14 kg/m2  SpO2 100% Physical Exam  Constitutional: He is oriented to person, place, and time. He appears well-developed and well-nourished. No distress.  Dyspnea with conversation.   HENT:  Head: Normocephalic and atraumatic.  Mouth/Throat: No oropharyngeal exudate.  Dry mucous membranes.   Eyes: Conjunctivae and EOM are normal. Pupils are equal, round, and reactive to light.  Neck: Normal range of motion. Neck supple.  No meningismus.  Cardiovascular: Normal rate, regular rhythm, normal heart sounds and intact distal pulses.   No murmur heard. Pulmonary/Chest: Effort normal. No respiratory distress. He has rhonchi. He exhibits tenderness.  Scattered rhonchi with  decreased breath sounds at the bases.  L rib and chest wall tenderness  Abdominal: Soft. There is no tenderness. There is no rebound and no guarding.  No abdominal tenderness.    Musculoskeletal: Normal range of motion. He exhibits no edema or tenderness.  Left UE fistula with good thrill.   Neurological: He is alert and oriented to person, place, and time. No cranial nerve deficit. He exhibits normal muscle tone. Coordination normal.  No ataxia on finger to nose bilaterally. No pronator drift. 5/5 strength throughout. CN 2-12 intact.Equal grip strength. Sensation intact.   Skin: Skin is warm.  Psychiatric:  He has a normal mood and affect. His behavior is normal.  Nursing note and vitals reviewed.  ED Course  Procedures (including critical care time) DIAGNOSTIC STUDIES: Oxygen Saturation is 82% on [place] 2L/min, low by my interpretation.    COORDINATION OF CARE: 8:52 PM Discussed treatment plan with pt at bedside and pt agreed to plan.   Labs Review Labs Reviewed  CBC WITH DIFFERENTIAL/PLATELET - Abnormal; Notable for the following:    WBC 18.5 (*)    RBC 3.96 (*)    Hemoglobin 12.9 (*)    HCT 38.3 (*)    RDW 16.2 (*)    Neutro Abs 16.2 (*)    Lymphs Abs 0.5 (*)    Monocytes Absolute 1.8 (*)    All other components within normal limits  BASIC METABOLIC PANEL - Abnormal; Notable for the following:    Chloride 97 (*)    Glucose, Bld 141 (*)    BUN 22 (*)    Creatinine, Ser 5.05 (*)    GFR calc non Af Amer 11 (*)    GFR calc Af Amer 12 (*)    Anion gap 16 (*)    All other components within normal limits  TROPONIN I - Abnormal; Notable for the following:    Troponin I 6.60 (*)    All other components within normal limits  BLOOD GAS, ARTERIAL - Abnormal; Notable for the following:    pH, Arterial 7.531 (*)    pCO2 arterial 29.5 (*)    pO2, Arterial 70.6 (*)    Bicarbonate 27.0 (*)    Allens test (pass/fail) NOT INDICATED (*)    All other components within normal limits   I-STAT CHEM 8, ED - Abnormal; Notable for the following:    Chloride 98 (*)    BUN 21 (*)    Creatinine, Ser 5.00 (*)    Glucose, Bld 142 (*)    Calcium, Ion 0.97 (*)    All other components within normal limits  I-STAT CG4 LACTIC ACID, ED - Abnormal; Notable for the following:    Lactic Acid, Venous 3.74 (*)    All other components within normal limits  CULTURE, BLOOD (ROUTINE X 2)  CULTURE, BLOOD (ROUTINE X 2)  HEPARIN LEVEL (UNFRACTIONATED)  CBC  URINALYSIS, ROUTINE W REFLEX MICROSCOPIC (NOT AT [facility])    Imaging Review Ct Angio Chest Pe W/cm &/or Wo Cm  12/16/2014  CLINICAL DATA:  Status post fall onto back. Dizziness, nausea and vomiting. Productive cough and left rib pain. Initial encounter. EXAM: CT ANGIOGRAPHY CHEST WITH CONTRAST TECHNIQUE: Multidetector CT imaging of the chest was performed using the standard protocol during bolus administration of intravenous contrast. Multiplanar CT image reconstructions and MIPs were obtained to evaluate the vascular anatomy. CONTRAST:  172mL OMNIPAQUE IOHEXOL 350 MG/ML SOLN COMPARISON:  Chest radiograph performed earlier today at 9:12 p.m. FINDINGS: There is no evidence of pulmonary embolus. Fluffy airspace opacification is noted within much of the right lung, and minimally within the left lung, with trace bilateral effusions. This may reflect asymmetric pulmonary edema or pneumonia. There is no evidence of pneumothorax. No masses are identified; no abnormal focal contrast enhancement is seen. Scattered coronary artery calcifications are seen. A moderate to large hiatal hernia is noted. A mildly prominent 1.3 cm azygoesophageal recess node is seen. A 1.2 cm right paratracheal node is noted. No pericardial effusion is identified. The great vessels are grossly unremarkable in appearance. No axillary lymphadenopathy is seen. The thyroid gland is unremarkable in appearance. The  visualized portions of the liver and spleen are unremarkable. The visualized  portions of the pancreas, gallbladder, stomach, adrenal glands and kidneys are within normal limits. No acute osseous abnormalities are seen. Review of the MIP images confirms the above findings. IMPRESSION: 1. No evidence of pulmonary embolus. 2. Fluffy airspace opacification within much of the right lung, and minimally within the left lung, with trace bilateral pleural effusions. This may reflect asymmetric pulmonary edema or pneumonia. 3. Scattered coronary artery calcifications seen. 4. Moderate to large hiatal hernia noted. 5. Mildly prominent mediastinal nodes seen. Electronically Signed   By: Garald Balding M.D.   On: 12/12/2014 21:58   Dg Chest Portable 1 View  12/30/2014  CLINICAL DATA:  Hypoxemia for 3 weeks.  Worsening rib pain. EXAM: PORTABLE CHEST 1 VIEW COMPARISON:  06/08/2012 FINDINGS: Normal cardiac silhouette. There is new airspace disease in the RIGHT lower lobe. LEFT lung is clear. No pneumothorax. IMPRESSION: RIGHT lower lobe lobar pneumonia. Followup PA and lateral chest X-ray is recommended in 3-4 weeks following trial of antibiotic therapy to ensure resolution and exclude underlying malignancy. Electronically Signed   By: Suzy Bouchard M.D.   On: 01/03/2015 21:28   I have personally reviewed and evaluated these images and lab results as part of my medical decision-making.   EKG Interpretation   Date/Time:  Friday December 31 2014 20:44:50 EST Ventricular Rate:  83 PR Interval:  146 QRS Duration: 140 QT Interval:  488 QTC Calculation: 573 R Axis:   -9 Text Interpretation:  Sinus rhythm Left bundle branch block new LBBB  Confirmed by Wyvonnia Dusky  MD, Najiyah Paris [phone]) on 01/05/2015 8:55:44 PM      MDM   Final diagnoses:  Sepsis, due to unspecified organism ([place])  Acute respiratory failure with hypoxia ([place])   Patient from home with hypotension, hypoxia, chest pain, shortness of breath, nausea and lightheadedness. Finish dialysis today area and has had constant  left-sided chest pain since yesterday that is dissimilar to his previous MI. Hypoxia to the 80s on arrival requiring nonrebreather.  EKG with new left bundle branch block. Bedside ultrasound does not show any pericardial effusion or tamponade.  High suspicion for pulmonary embolism. Patient is aware contrast could injure his kidneys further but states he has been on dialysis for more than a year and was told his renal function will not recover.  Discussed with his cardiologist Dr. Einar Gip.  He agrees with PE workup and not activate STEMI at this time.  Chest x-ray shows large right-sided pneumonia. CT scan shows no PE but does show pneumonia versus H logic edema. Patient is given IV fluids, broad-spectrum antibiotics, blood cultures obtained. Troponin 6.6. This is more than would be expected for demand ischemia. IV heparin started. Dr. Einar Gip Updated.  ABG shows hyperventilation with respiratory alkalosis. Patient's work of breathing is increasing. Decision made to intubate.  Discussed with critical care doctor Dr. Jimmy Footman she accepts patient to [facility] and agrees with intubation prior to transport.  Patient intubated as above. ABG improving.  antibiotics given.  Heparin gtt for NSTEMI.  Patient with brief episode of V tach that spontaneously resolved. 2 mag given. Potassium normal. Dr. Jimmy Footman updated.  Family updated. Awaiting transport to [facility] ICU. Dr. Roxanne Mins aware of patient presence in ED.    EMERGENCY DEPARTMENT [place] CARDIAC EXAM "Study: Limited Ultrasound of the heart and pericardium"  INDICATIONS:Hypotension and Dyspnea Multiple views of the heart and pericardium are obtained with a multi-frequency probe.  PERFORMED [id number]  IMAGES ARCHIVED?:  Yes  FINDINGS: No pericardial effusion, Normal contractility and Tamponade physiology absent  LIMITATIONS:  Body habitus and Emergent procedure  VIEWS USED: Subcostal 4 chamber and Apical 4 chamber   INTERPRETATION: Cardiac  activity present, Pericardial effusioin absent, Cardiac tamponade absent and Normal contractility  COMMENT:     10:41 PM  EMERGENT INTUBATION PROCEDURE NOTE  INDICATION: impending respiratory failure   TECHNIQUE: Risks of procedure as well as the alternatives and risks of each were explained to the (patient/caregiver).  Consent for procedure obtained.   After pre-oxygenating the patient for 10 minutes, a modified rapid-sequence induction was performed using 20 mg etomidate and 100 mg rocuronium with cricoid pressure. Using a Macintosh 3 video-assisted laryngoscope blade and 7.69mm cuffed endotracheal tube was placed and secured.   Placement was confirmed with by auscultation and by CXR.   COMPLICATIONS: None. The patient tolerated the procedure well with no complications.  POST PROCEDURE CXR: tube position acceptable       CRITICAL CARE Performed by: Ezequiel Essex, MD  Total critical care time: 90 minutes Critical care time was exclusive of separately billable procedures and treating other patients. Critical care was necessary to treat or prevent imminent or life-threatening deterioration. Critical care was time spent personally by me on the following activities: development of treatment plan with patient and/or surrogate as well as nursing, discussions with consultants, evaluation of patient's response to treatment, examination of patient, obtaining history from patient or surrogate, ordering and performing treatments and interventions, ordering and review of laboratory studies, ordering and review of radiographic studies, pulse oximetry and re-evaluation of patient's condition.  I personally performed the services described in this documentation, which was scribed in my presence. The recorded information has been reviewed and is accurate.  Ezequiel Essex, MD 01/04/2015 [id number]  Ezequiel Essex, MD 12/11/2014 [phone]

## 2014-12-31 NOTE — Progress Notes (Signed)
ANTICOAGULATION CONSULT NOTE - Initial Consult  Pharmacy Consult for Heparin Indication: chest pain/ACS  Allergies  Allergen Reactions  . Clonidine Derivatives Other (See Comments)    Hallucinations, anxious,insomnia    Patient Measurements: Height: 5\' 9"  (175.3 cm) Weight: 150 lb (68.04 kg) IBW/kg (Calculated) : 70.7 HEPARIN DW (KG): 68  Vital Signs: Temp: 98.8 F (37.1 C) (11/25 2034) Temp Source: Oral (11/25 2034) BP: 111/81 mmHg (11/25 2100) Pulse Rate: 80 (11/25 2100)  Labs:  Recent Labs  12/30/2014 2059 12/18/2014 2107  HGB 12.9* 13.6  HCT 38.3* 40.0  PLT 319  --   CREATININE 5.05* 5.00*  TROPONINI 6.60*  --     Estimated Creatinine Clearance: 13.4 mL/min (by C-G formula based on Cr of 5).   Medical History: Past Medical History  Diagnosis Date  . Hypertension   . Gout     occasional  . Diverticulitis   . Mass on back     "fatty tumor"  . GERD (gastroesophageal reflux disease)   . Heart murmur   . High cholesterol   . History of stomach ulcers     "once"  . Stroke 1980's?    denies residual on 03/17/2013  . Anxiety   . Renal disorder   . CKD (chronic kidney disease) stage 4, GFR 15-29 ml/min     "fistula ready; not on dialysis yet" (03/17/2013)   Assessment: Asked to initiate Heparin for ACS.  Heparin started at [facility]>  Goal of Therapy:  Heparin level 0.3-0.7 units/ml Monitor platelets by anticoagulation protocol: Yes   Plan:  Heparin 4000 units bolus x 1 Heparin infusion at 950 units/hr Check HL daily  Hart Robinsons A 01/03/2015,10:03 PM

## 2014-12-31 NOTE — ED Notes (Addendum)
Pt states that while he was in dialysis he became nauseated -  States that he went home and has had a couple of episodes of nausea with some dry heaving - Per EMS pt hypotensive when they arrived on scene 90/50 -IV started and small bolus ( approx 142ml given ) pt was placed in slight trendelenburg position and bp improved some - O2 sat also noted to be low and pt co some pain lt lower rib cage/chest area  - especially with deep breath

## 2014-12-31 NOTE — ED Notes (Signed)
resp therapist in room to obtain ABG

## 2014-12-31 NOTE — ED Notes (Signed)
Per pharmacy, heparin & propofol are compatible & run vancomycin  alone.

## 2014-12-31 NOTE — ED Notes (Signed)
Pt presently having CT angio performed

## 2014-12-31 NOTE — ED Notes (Signed)
O2 non re breather mask placed on pt by MD - O2 sat continously dropping down to low 80's

## 2014-12-31 NOTE — Progress Notes (Signed)
Patient on BiPAP 12/6 f 8 [id number] briefly before elective intubation.

## 2015-01-01 ENCOUNTER — Encounter (HOSPITAL_COMMUNITY): Admission: EM | Disposition: E | Payer: Medicare Other | Source: Home / Self Care | Attending: Internal Medicine

## 2015-01-01 ENCOUNTER — Inpatient Hospital Stay (HOSPITAL_COMMUNITY): Payer: Medicare Other

## 2015-01-01 ENCOUNTER — Encounter (HOSPITAL_COMMUNITY): Admission: EM | Disposition: E | Payer: Self-pay | Source: Home / Self Care | Attending: Internal Medicine

## 2015-01-01 DIAGNOSIS — J9601 Acute respiratory failure with hypoxia: Secondary | ICD-10-CM | POA: Insufficient documentation

## 2015-01-01 DIAGNOSIS — J189 Pneumonia, unspecified organism: Secondary | ICD-10-CM

## 2015-01-01 HISTORY — PX: CARDIAC CATHETERIZATION: SHX172

## 2015-01-01 LAB — URINALYSIS, ROUTINE W REFLEX MICROSCOPIC
Bilirubin Urine: NEGATIVE
GLUCOSE, UA: NEGATIVE mg/dL
Ketones, ur: NEGATIVE mg/dL
Nitrite: POSITIVE — AB
SPECIFIC GRAVITY, URINE: 1.02 (ref 1.005–1.030)
pH: 6.5 (ref 5.0–8.0)

## 2015-01-01 LAB — RENAL FUNCTION PANEL
Albumin: 2.3 g/dL — ABNORMAL LOW (ref 3.5–5.0)
Anion gap: 14 (ref 5–15)
BUN: 36 mg/dL — AB (ref 6–20)
CALCIUM: 7.7 mg/dL — AB (ref 8.9–10.3)
CHLORIDE: 102 mmol/L (ref 101–111)
CO2: 20 mmol/L — ABNORMAL LOW (ref 22–32)
CREATININE: 5.82 mg/dL — AB (ref 0.61–1.24)
GFR, EST AFRICAN AMERICAN: 10 mL/min — AB (ref >60)
GFR, EST NON AFRICAN AMERICAN: 9 mL/min — AB (ref >60)
Glucose, Bld: 133 mg/dL — ABNORMAL HIGH (ref 65–99)
Phosphorus: 4.1 mg/dL (ref 2.5–4.6)
Potassium: 4.2 mmol/L (ref 3.5–5.1)
SODIUM: 136 mmol/L (ref 135–145)

## 2015-01-01 LAB — TYPE AND SCREEN
ABO/RH(D): O NEG
Antibody Screen: NEGATIVE

## 2015-01-01 LAB — COMPREHENSIVE METABOLIC PANEL
ALK PHOS: 58 U/L (ref 38–126)
ALT: 27 U/L (ref 17–63)
AST: 160 U/L — AB (ref 15–41)
Albumin: 2.7 g/dL — ABNORMAL LOW (ref 3.5–5.0)
Anion gap: 15 (ref 5–15)
BUN: 26 mg/dL — ABNORMAL HIGH (ref 6–20)
CALCIUM: 8 mg/dL — AB (ref 8.9–10.3)
CO2: 22 mmol/L (ref 22–32)
CREATININE: 5.4 mg/dL — AB (ref 0.61–1.24)
Chloride: 100 mmol/L — ABNORMAL LOW (ref 101–111)
GFR calc non Af Amer: 10 mL/min — ABNORMAL LOW (ref >60)
GFR, EST AFRICAN AMERICAN: 11 mL/min — AB (ref >60)
GLUCOSE: 152 mg/dL — AB (ref 65–99)
POTASSIUM: 4.3 mmol/L (ref 3.5–5.1)
Sodium: 137 mmol/L (ref 135–145)
TOTAL PROTEIN: 6.7 g/dL (ref 6.5–8.1)
Total Bilirubin: 1.2 mg/dL (ref 0.3–1.2)

## 2015-01-01 LAB — GLUCOSE, CAPILLARY
GLUCOSE-CAPILLARY: 184 mg/dL — AB (ref 65–99)
Glucose-Capillary: 139 mg/dL — ABNORMAL HIGH (ref 65–99)

## 2015-01-01 LAB — CBC WITH DIFFERENTIAL/PLATELET
BASOS ABS: 0.1 10*3/uL (ref 0.0–0.1)
Basophils Relative: 0 %
Eosinophils Absolute: 0 10*3/uL (ref 0.0–0.7)
Eosinophils Relative: 0 %
HEMATOCRIT: 36.9 % — AB (ref 39.0–52.0)
HEMOGLOBIN: 12 g/dL — AB (ref 13.0–17.0)
LYMPHS ABS: 0.7 10*3/uL (ref 0.7–4.0)
LYMPHS PCT: 4 %
MCH: 32.5 pg (ref 26.0–34.0)
MCHC: 32.5 g/dL (ref 30.0–36.0)
MCV: 100 fL (ref 78.0–100.0)
Monocytes Absolute: 2.1 10*3/uL — ABNORMAL HIGH (ref 0.1–1.0)
Monocytes Relative: 12 %
NEUTROS ABS: 14.5 10*3/uL — AB (ref 1.7–7.7)
NEUTROS PCT: 84 %
PLATELETS: 262 10*3/uL (ref 150–400)
RBC: 3.69 MIL/uL — AB (ref 4.22–5.81)
RDW: 16.8 % — ABNORMAL HIGH (ref 11.5–15.5)
WBC: 17.4 10*3/uL — AB (ref 4.0–10.5)

## 2015-01-01 LAB — BLOOD GAS, ARTERIAL
ACID-BASE DEFICIT: 2 mmol/L (ref 0.0–2.0)
BICARBONATE: 20.9 meq/L (ref 20.0–24.0)
DRAWN BY: 441351
FIO2: 0.4
LHR: 18 {breaths}/min
O2 SAT: 92.9 %
PEEP/CPAP: 5 cmH2O
PH ART: 7.492 — AB (ref 7.350–7.450)
Patient temperature: 98.8
TCO2: 21.7 mmol/L (ref 0–100)
VT: 480 mL
pCO2 arterial: 27.6 mmHg — ABNORMAL LOW (ref 35.0–45.0)
pO2, Arterial: 65.5 mmHg — ABNORMAL LOW (ref 80.0–100.0)

## 2015-01-01 LAB — LACTIC ACID, PLASMA
LACTIC ACID, VENOUS: 3.8 mmol/L — AB (ref 0.5–2.0)
Lactic Acid, Venous: 2.5 mmol/L (ref 0.5–2.0)

## 2015-01-01 LAB — I-STAT CG4 LACTIC ACID, ED: LACTIC ACID, VENOUS: 4.83 mmol/L — AB (ref 0.5–2.0)

## 2015-01-01 LAB — MAGNESIUM
Magnesium: 1.9 mg/dL (ref 1.7–2.4)
Magnesium: 2.5 mg/dL — ABNORMAL HIGH (ref 1.7–2.4)

## 2015-01-01 LAB — ABO/RH: ABO/RH(D): O NEG

## 2015-01-01 LAB — MRSA PCR SCREENING: MRSA by PCR: NEGATIVE

## 2015-01-01 LAB — URINE MICROSCOPIC-ADD ON

## 2015-01-01 LAB — TROPONIN I
Troponin I: 60.89 ng/mL (ref <0.031)
Troponin I: >65 ng/mL (ref <0.031)

## 2015-01-01 LAB — HEPARIN LEVEL (UNFRACTIONATED): Heparin Unfractionated: 0.81 IU/mL — ABNORMAL HIGH (ref 0.30–0.70)

## 2015-01-01 LAB — PHOSPHORUS: PHOSPHORUS: 4.8 mg/dL — AB (ref 2.5–4.6)

## 2015-01-01 SURGERY — LEFT HEART CATH
Anesthesia: LOCAL | Laterality: Right

## 2015-01-01 SURGERY — LEFT HEART CATH
Anesthesia: LOCAL

## 2015-01-01 MED ORDER — MIDAZOLAM HCL 2 MG/2ML IJ SOLN
INTRAMUSCULAR | Status: AC
  Administered 2015-01-01: 2 mg
  Filled 2015-01-01: qty 2

## 2015-01-01 MED ORDER — VANCOMYCIN HCL 500 MG IV SOLR
500.0000 mg | Freq: Once | INTRAVENOUS | Status: AC
  Administered 2015-01-01: 500 mg via INTRAVENOUS
  Filled 2015-01-01 (×2): qty 500

## 2015-01-01 MED ORDER — FENTANYL CITRATE (PF) 100 MCG/2ML IJ SOLN
100.0000 ug | INTRAMUSCULAR | Status: DC | PRN
  Administered 2015-01-01 – 2015-01-02 (×4): 100 ug via INTRAVENOUS
  Filled 2015-01-01 (×4): qty 2

## 2015-01-01 MED ORDER — DEXTROSE 5 % IV SOLN
10.0000 mg | INTRAVENOUS | Status: DC | PRN
  Administered 2015-01-01: 50 ug/min via INTRAVENOUS

## 2015-01-01 MED ORDER — LIDOCAINE HCL (PF) 1 % IJ SOLN
INTRAMUSCULAR | Status: AC
  Filled 2015-01-01: qty 30

## 2015-01-01 MED ORDER — SODIUM CHLORIDE 0.9 % IJ SOLN: 10.0000 mL | INTRAMUSCULAR | Status: DC | PRN

## 2015-01-01 MED ORDER — MIDAZOLAM HCL 2 MG/2ML IJ SOLN
2.0000 mg | INTRAMUSCULAR | Status: DC | PRN
Start: 2015-01-01 — End: 2015-01-02
  Administered 2015-01-02: 2 mg via INTRAVENOUS
  Filled 2015-01-01: qty 2

## 2015-01-01 MED ORDER — SODIUM CHLORIDE 0.9 % IV SOLN
250.0000 mL | INTRAVENOUS | Status: DC | PRN
  Administered 2015-01-01: 250 mL via INTRAVENOUS

## 2015-01-01 MED ORDER — SODIUM CHLORIDE 0.9 % IV SOLN: 250.0000 mL | INTRAVENOUS | Status: DC | PRN

## 2015-01-01 MED ORDER — CHLORHEXIDINE GLUCONATE 0.12% ORAL RINSE (MEDLINE KIT)
15.0000 mL | Freq: Two times a day (BID) | OROMUCOSAL | Status: DC
  Administered 2015-01-01 (×2): 15 mL via OROMUCOSAL

## 2015-01-01 MED ORDER — NITROGLYCERIN 1 MG/10 ML FOR IR/CATH LAB
INTRA_ARTERIAL | Status: AC
  Filled 2015-01-01: qty 10

## 2015-01-01 MED ORDER — ASPIRIN 325 MG PO TABS
325.0000 mg | ORAL_TABLET | ORAL | Status: DC
  Administered 2015-01-01: 325 mg via ORAL
  Filled 2015-01-01: qty 1

## 2015-01-01 MED ORDER — TICAGRELOR 90 MG PO TABS
90.0000 mg | ORAL_TABLET | Freq: Two times a day (BID) | ORAL | Status: DC
  Administered 2015-01-01: 90 mg via ORAL
  Filled 2015-01-01 (×3): qty 1

## 2015-01-01 MED ORDER — LIDOCAINE HCL (PF) 1 % IJ SOLN
INTRAMUSCULAR | Status: DC | PRN
  Administered 2015-01-01: 20 mL via INTRADERMAL

## 2015-01-01 MED ORDER — SODIUM CHLORIDE 0.9 % IV BOLUS (SEPSIS)
250.0000 mL | Freq: Once | INTRAVENOUS | Status: AC
  Administered 2015-01-01: 250 mL via INTRAVENOUS

## 2015-01-01 MED ORDER — PHENYLEPHRINE HCL 10 MG/ML IJ SOLN
30.0000 ug/min | INTRAVENOUS | Status: DC
  Administered 2015-01-01 (×4): 30 ug/min via INTRAVENOUS
  Filled 2015-01-01 (×4): qty 1

## 2015-01-01 MED ORDER — ASPIRIN 81 MG PO CHEW: 324.0000 mg | CHEWABLE_TABLET | ORAL | Status: DC

## 2015-01-01 MED ORDER — "THROMBI-PAD 3""X3"" EX PADS"
1.0000 | MEDICATED_PAD | Freq: Once | CUTANEOUS | Status: AC
  Administered 2015-01-01: 1 via TOPICAL
  Filled 2015-01-01: qty 1

## 2015-01-01 MED ORDER — SODIUM CHLORIDE 0.9 % IJ SOLN: 3.0000 mL | INTRAMUSCULAR | Status: DC | PRN

## 2015-01-01 MED ORDER — VITAL AF 1.2 CAL PO LIQD
1000.0000 mL | ORAL | Status: DC
  Administered 2015-01-01: 1000 mL
  Filled 2015-01-01 (×6): qty 1000

## 2015-01-01 MED ORDER — HEPARIN SODIUM (PORCINE) 5000 UNIT/ML IJ SOLN
5000.0000 [IU] | Freq: Three times a day (TID) | INTRAMUSCULAR | Status: DC
  Administered 2015-01-01 – 2015-01-02 (×2): 5000 [IU] via SUBCUTANEOUS
  Filled 2015-01-01 (×2): qty 1

## 2015-01-01 MED ORDER — IOHEXOL 350 MG/ML SOLN
INTRAVENOUS | Status: DC | PRN
  Administered 2015-01-01: 125 mL via INTRACARDIAC

## 2015-01-01 MED ORDER — SODIUM CHLORIDE 0.9 % IJ SOLN
3.0000 mL | Freq: Two times a day (BID) | INTRAMUSCULAR | Status: DC
  Administered 2015-01-01 (×2): 3 mL via INTRAVENOUS

## 2015-01-01 MED ORDER — FENTANYL CITRATE (PF) 100 MCG/2ML IJ SOLN
INTRAMUSCULAR | Status: AC
  Administered 2015-01-01: 100 ug
  Filled 2015-01-01: qty 2

## 2015-01-01 MED ORDER — ASPIRIN 300 MG RE SUPP: 300.0000 mg | RECTAL | Status: DC

## 2015-01-01 MED ORDER — SODIUM CHLORIDE 0.9 % IJ SOLN
10.0000 mL | Freq: Two times a day (BID) | INTRAMUSCULAR | Status: DC
  Administered 2015-01-01 (×2): 10 mL

## 2015-01-01 MED ORDER — HEPARIN (PORCINE) IN NACL 2-0.9 UNIT/ML-% IJ SOLN
INTRAMUSCULAR | Status: AC
  Filled 2015-01-01: qty 1000

## 2015-01-01 MED ORDER — ANTISEPTIC ORAL RINSE SOLUTION (CORINZ): 7.0000 mL | Freq: Four times a day (QID) | OROMUCOSAL | Status: DC

## 2015-01-01 MED ORDER — ASPIRIN EC 325 MG PO TBEC: 325.0000 mg | DELAYED_RELEASE_TABLET | Freq: Every day | ORAL | Status: DC

## 2015-01-01 MED ORDER — VERAPAMIL HCL 2.5 MG/ML IV SOLN
INTRAVENOUS | Status: AC
Start: 2015-01-01 — End: 2015-01-01
  Filled 2015-01-01: qty 2

## 2015-01-01 MED ORDER — PANTOPRAZOLE SODIUM 20 MG PO TBEC
20.0000 mg | DELAYED_RELEASE_TABLET | Freq: Every day | ORAL | Status: DC
  Filled 2015-01-01: qty 1

## 2015-01-01 MED ORDER — CHLORHEXIDINE GLUCONATE 0.12% ORAL RINSE (MEDLINE KIT): 15.0000 mL | Freq: Two times a day (BID) | OROMUCOSAL | Status: DC

## 2015-01-01 MED ORDER — PIPERACILLIN-TAZOBACTAM IN DEX 2-0.25 GM/50ML IV SOLN
2.2500 g | Freq: Three times a day (TID) | INTRAVENOUS | Status: DC
  Administered 2015-01-01 – 2015-01-02 (×4): 2.25 g via INTRAVENOUS
  Filled 2015-01-01 (×7): qty 50

## 2015-01-01 MED ORDER — SODIUM CHLORIDE 0.9 % WEIGHT BASED INFUSION
1.0000 mL/kg/h | INTRAVENOUS | Status: DC
  Administered 2015-01-01: 1 mL/kg/h via INTRAVENOUS

## 2015-01-01 MED ORDER — ANTISEPTIC ORAL RINSE SOLUTION (CORINZ)
7.0000 mL | Freq: Four times a day (QID) | OROMUCOSAL | Status: DC
  Administered 2015-01-01 – 2015-01-02 (×5): 7 mL via OROMUCOSAL

## 2015-01-01 MED ORDER — VANCOMYCIN HCL IN DEXTROSE 750-5 MG/150ML-% IV SOLN: 750.0000 mg | INTRAVENOUS | Status: DC

## 2015-01-01 MED ORDER — VITAL HIGH PROTEIN PO LIQD
1000.0000 mL | ORAL | Status: DC
  Filled 2015-01-01 (×2): qty 1000

## 2015-01-01 SURGICAL SUPPLY — 21 items
CATH BALLN WEDGE 5F 110CM (CATHETERS) ×1 IMPLANT
CATH INFINITI 5FR AL1 (CATHETERS) ×1 IMPLANT
CATH INFINITI 5FR ANG PIGTAIL (CATHETERS) ×1 IMPLANT
CATH INFINITI 5FR JL4 (CATHETERS) ×1 IMPLANT
CATH INFINITI 5FR MPB2 (CATHETERS) ×1 IMPLANT
CATH SWAN GANZ 7F STRAIGHT (CATHETERS) ×1 IMPLANT
DEVICE CLOSURE PERCLS PRGLD 6F (VASCULAR PRODUCTS) IMPLANT
GLIDESHEATH SLEND A-KIT 6F 20G (SHEATH) ×3 IMPLANT
GUIDEWIRE ANGLED .035X150CM (WIRE) ×1 IMPLANT
KIT HEART LEFT (KITS) ×3 IMPLANT
PACK CARDIAC CATHETERIZATION (CUSTOM PROCEDURE TRAY) ×3 IMPLANT
PERCLOSE PROGLIDE 6F (VASCULAR PRODUCTS) ×3
SET INTRODUCER MICROPUNCT 5F (INTRODUCER) ×1 IMPLANT
SHEATH PINNACLE 5F 10CM (SHEATH) ×2 IMPLANT
SHEATH PINNACLE 7F 10CM (SHEATH) ×1 IMPLANT
SLEEVE REPOSITIONING LENGTH 30 (MISCELLANEOUS) ×1 IMPLANT
TRANSDUCER W/STOPCOCK (MISCELLANEOUS) ×3 IMPLANT
TUBING CIL FLEX 10 FLL-RA (TUBING) ×3 IMPLANT
WIRE EMERALD 3MM-J .035X150CM (WIRE) ×1 IMPLANT
WIRE EMERALD 3MM-J .035X260CM (WIRE) ×1 IMPLANT
WIRE HI TORQ VERSACORE-J 145CM (WIRE) ×1 IMPLANT

## 2015-01-01 NOTE — Progress Notes (Signed)
CRITICAL VALUE ALERT  Critical value received:  Troponin 60.89  Date of notification:  12/15/2014  Time of notification:  0558  Critical value read back:Yes.    Nurse who received alert:  Timmothy Sours, RN  MD notified (1st page):  eLink MD  Time of first page:  0559  Responding MD:  Deterding, MD  Time MD responded:  [phone]

## 2015-01-01 NOTE — Progress Notes (Signed)
ANTIBIOTIC CONSULT NOTE - INITIAL  Pharmacy Consult for Vancomycin  Indication: rule out sepsis  Allergies  Allergen Reactions  . Clonidine Derivatives Other (See Comments)    Hallucinations, anxious,insomnia    Patient Measurements: Height: 5\' 9"  (175.3 cm) Weight: 148 lb 2.4 oz (67.2 kg) IBW/kg (Calculated) : 70.7  Vital Signs: Temp: 99.5 F (37.5 C) (11/26 0530) Temp Source: Core (Comment) (11/26 0400) BP: 97/73 mmHg (11/26 0530) Pulse Rate: 79 (11/26 0530) Intake/Output from previous day: 11/25 0701 - 11/26 0700 In: 1338.6 [I.V.:1088.6; IV Piggyback:250] Out: 550 [Urine:550] Intake/Output from this shift: Total I/O In: 1338.6 [I.V.:1088.6; IV Piggyback:250] Out: 550 [Urine:550]  Labs:  Recent Labs  12/20/2014 2059 12/13/2014 2107 12/07/2014 0414  WBC 18.5*  --  17.4*  HGB 12.9* 13.6 12.0*  PLT 319  --  262  CREATININE 5.05* 5.00* 5.40*   Estimated Creatinine Clearance: 12.1 mL/min (by C-G formula based on Cr of 5.4). No results for input(s): VANCOTROUGH, VANCOPEAK, VANCORANDOM, GENTTROUGH, GENTPEAK, GENTRANDOM, TOBRATROUGH, TOBRAPEAK, TOBRARND, AMIKACINPEAK, AMIKACINTROU, AMIKACIN in the last 72 hours.   Microbiology: Recent Results (from the past 720 hour(s))  Blood culture (routine x 2)     Status: None (Preliminary result)   Collection Time: 12/13/2014 10:05 PM  Result Value Ref Range Status   Specimen Description BLOOD RIGHT HAND  Final   Special Requests BOTTLES DRAWN AEROBIC ONLY 6CC  Final   Culture PENDING  Incomplete   Report Status PENDING  Incomplete  Blood culture (routine x 2)     Status: None (Preliminary result)   Collection Time: 12/30/2014 10:11 PM  Result Value Ref Range Status   Specimen Description BLOOD RIGHT HAND  Final   Special Requests BOTTLES DRAWN AEROBIC ONLY [place]  Final   Culture PENDING  Incomplete   Report Status PENDING  Incomplete  MRSA PCR Screening     Status: None   Collection Time: 01/03/2015  2:23 AM  Result Value Ref Range  Status   MRSA by PCR NEGATIVE NEGATIVE Final    Comment:        The GeneXpert MRSA Assay (FDA approved for NASAL specimens only), is one component of a comprehensive MRSA colonization surveillance program. It is not intended to diagnose MRSA infection nor to guide or monitor treatment for MRSA infections.    Assessment: 69 y.o. male with VDRF/PNA/septic shock for empiric antibiotics.  Vancomycin 1 g IV given in ED at midnight  Goal of Therapy:  Vancomycin pre-HD level 15-25  Plan:  Vancomycin 500 mg IV now, then 750 mg IV after each HD   Babetta Paterson, Bronson Curb 01/01/2015,6:05 AM

## 2015-01-01 NOTE — H&P (Signed)
PULMONARY / CRITICAL CARE MEDICINE HISTORY AND PHYSICAL EXAMINATION   Name: Charles Zuniga MRN: [id number] DOB: [birth year]    ADMISSION DATE:  12/18/2014  PRIMARY SERVICE: PCCM  CHIEF COMPLAINT:  Weakness, chest pain  BRIEF PATIENT DESCRIPTION: 69 y/o man with ESRD withresp failure, h/o cad, AS concern pneumonia   SIGNIFICANT EVENTS / STUDIES:  CT-PA showing no PE, but infiltrate on R. 11/26 cath lab, severe AS, LAD open  LINES / TUBES: PIVs ETT 11/25>> NGT11/25>> Fem swan 11/26 Rt IJ 11/26>>>  CULTURES: Blood cx 11/25>> Urine cx 11/25>> Lower resp cx 11/25>>  ANTIBIOTICS: Vanc: 11/25>> Pip-tazo: 11/25>>  SUBJECTIVE:  This am severe agitation on vent  VITAL SIGNS: Temp:  [98.6 F (37 C)-101.1 F (38.4 C)] 100.8 F (38.2 C) (11/26 1520) Pulse Rate:  [62-295] 76 (11/26 1520) Resp:  [0-33] 21 (11/26 1520) BP: (68-159)/(44-118) 88/66 mmHg (11/26 1515) SpO2:  [0 %-100 %] 100 % (11/26 1520) FiO2 (%):  [40 %-100 %] 50 % (11/26 1225) Weight:  [67.2 kg (148 lb 2.4 oz)-70 kg (154 lb 5.2 oz)] 70 kg (154 lb 5.2 oz) (11/26 1100) HEMODYNAMICS: PAP: (38-58)/(16-29) 38/16 mmHg CVP:  [2 mmHg-5 mmHg] 2 mmHg PCWP:  [13 mmHg] 13 mmHg CO:  [4.6 L/min] 4.6 L/min CI:  [4.9 L/min/m2] 4.9 L/min/m2 VENTILATOR SETTINGS: Vent Mode:  [-] PRVC FiO2 (%):  [40 %-100 %] 50 % Set Rate:  [15 bmp-24 bmp] 15 bmp Vt Set:  [480 mL-530 mL] 530 mL PEEP:  [5 cmH20-12 cmH20] 5 cmH20 Plateau Pressure:  [15 cmH20-28 cmH20] 16 cmH20 INTAKE / OUTPUT: Intake/Output      11/25 0701 - 11/26 0700 11/26 0701 - 11/27 0700   I.V. (mL/kg) 1187.3 (17.7) 537.2 (7.7)   IV Piggyback 250 200   Total Intake(mL/kg) 1437.3 (21.4) 737.2 (10.5)   Urine (mL/kg/hr) 550 125 (0.2)   Total Output 550 125   Net +887.3 +612.2          PHYSICAL EXAMINATION: General:  Elderly man intubated lying in bed, rass high Neuro:  Awakes, ressonds but sweating, agitation, moves all ext equally HEENT:  jvd , line clean Neck:  jvd Cardiovascular:  s1 s2 Regular rate, rhythm Lungs:  ronchi rt greater left Abdomen:  Soft, nontender, no r/g Musculoskeletal:  No swollen joints Skin:  No rashes  LABS:  CBC  Recent Labs Lab 12/27/2014 2059 12/19/2014 2107 12/25/2014 0414  WBC 18.5*  --  17.4*  HGB 12.9* 13.6 12.0*  HCT 38.3* 40.0 36.9*  PLT 319  --  262   Coag's No results for input(s): APTT, INR in the last 168 hours. BMET  Recent Labs Lab 01/03/2015 2059 01/04/2015 2107 12/12/2014 0414  NA 138 139 137  K 4.3 4.2 4.3  CL 97* 98* 100*  CO2 25  --  22  BUN 22* 21* 26*  CREATININE 5.05* 5.00* 5.40*  GLUCOSE 141* 142* 152*   Electrolytes  Recent Labs Lab 12/12/2014 2016 12/10/2014 2059 12/22/2014 0414  CALCIUM  --  8.9 8.0*  MG 1.9  --  2.5*  PHOS  --   --  4.8*   Sepsis Markers  Recent Labs Lab 12/24/2014 0036 01/03/2015 0414 12/13/2014 0700  LATICACIDVEN 4.83* 3.8* 2.5*   ABG  Recent Labs Lab 12/13/2014 2109 12/12/2014 2325 01/01/15 0400  PHART 7.531* 7.388 7.492*  PCO2ART 29.5* 34.7* 27.6*  PO2ART 70.6* 111.0* 65.5*   Liver Enzymes  Recent Labs Lab 01/01/15 0414  AST 160*  ALT 27  ALKPHOS 58  BILITOT 1.2  ALBUMIN 2.7*   Cardiac Enzymes  Recent Labs Lab 12/20/2014 2059 12/08/2014 0414 12/25/2014 1249  TROPONINI 6.60* 60.89* >65.00*   Glucose  Recent Labs Lab 12/22/2014 0228  GLUCAP 184*    Imaging Ct Angio Chest Pe W/cm &/or Wo Cm  01/04/2015  CLINICAL DATA:  Status post fall onto back. Dizziness, nausea and vomiting. Productive cough and left rib pain. Initial encounter. EXAM: CT ANGIOGRAPHY CHEST WITH CONTRAST TECHNIQUE: Multidetector CT imaging of the chest was performed using the standard protocol during bolus administration of intravenous contrast. Multiplanar CT image reconstructions and MIPs were obtained to evaluate the vascular anatomy. CONTRAST:  120mL OMNIPAQUE IOHEXOL 350 MG/ML SOLN COMPARISON:  Chest radiograph performed earlier today at 9:12 p.m. FINDINGS: There is no  evidence of pulmonary embolus. Fluffy airspace opacification is noted within much of the right lung, and minimally within the left lung, with trace bilateral effusions. This may reflect asymmetric pulmonary edema or pneumonia. There is no evidence of pneumothorax. No masses are identified; no abnormal focal contrast enhancement is seen. Scattered coronary artery calcifications are seen. A moderate to large hiatal hernia is noted. A mildly prominent 1.3 cm azygoesophageal recess node is seen. A 1.2 cm right paratracheal node is noted. No pericardial effusion is identified. The great vessels are grossly unremarkable in appearance. No axillary lymphadenopathy is seen. The thyroid gland is unremarkable in appearance. The visualized portions of the liver and spleen are unremarkable. The visualized portions of the pancreas, gallbladder, stomach, adrenal glands and kidneys are within normal limits. No acute osseous abnormalities are seen. Review of the MIP images confirms the above findings. IMPRESSION: 1. No evidence of pulmonary embolus. 2. Fluffy airspace opacification within much of the right lung, and minimally within the left lung, with trace bilateral pleural effusions. This may reflect asymmetric pulmonary edema or pneumonia. 3. Scattered coronary artery calcifications seen. 4. Moderate to large hiatal hernia noted. 5. Mildly prominent mediastinal nodes seen. Electronically Signed   By: Garald Balding M.D.   On: 12/13/2014 21:58   Dg Chest Port 1 View  12/08/2014  CLINICAL DATA:  Septic shock, acute onset.  Initial encounter. EXAM: PORTABLE CHEST 1 VIEW COMPARISON:  Chest radiograph performed 12/12/2014 FINDINGS: The patient's endotracheal tube is seen ending 4 cm above the carina. An enteric tube is noted extending below the diaphragm. A right IJ line is noted ending about the cavoatrial junction. Bilateral airspace opacification is again noted, right greater than left, mildly worsened from the prior study.  This is concerning for multifocal pneumonia, though asymmetric interstitial edema might have a similar appearance. No definite pleural effusion or pneumothorax is seen. The cardiomediastinal silhouette is borderline normal in size. No acute osseous abnormalities are identified. IMPRESSION: 1. Endotracheal tube seen ending 4 cm above the carina. 2. Bilateral airspace opacification, right greater than left, mildly worsened from the prior study. This is concerning for multifocal pneumonia, though asymmetric interstitial edema might have a similar appearance. Electronically Signed   By: Garald Balding M.D.   On: 01/01/2015 06:01   Dg Chest Portable 1 View  12/26/2014  CLINICAL DATA:  Post intubation. History of stroke, hypertension, heart murmur, left heart catheterization, chronic kidney disease, renal disorder. EXAM: PORTABLE CHEST 1 VIEW COMPARISON:  CT chest 12/16/2014.  Chest 12/12/2014. FINDINGS: Interval placement of an endotracheal tube with tip measuring 3.7 cm above the carina. Enteric tube is been placed with tip in the left upper quadrant consistent with location in the upper stomach. Persistent diffuse airspace  disease in the right lung and in the left lower lung. Esophageal hiatal hernia behind the heart. Normal heart size and pulmonary vascularity. IMPRESSION: Endotracheal tube placed with tip measuring 3.7 cm above the carina. Persistent bilateral airspace disease in the lungs. Electronically Signed   By: Lucienne Capers M.D.   On: 12/17/2014 23:26   Dg Chest Portable 1 View  12/22/2014  CLINICAL DATA:  Hypoxemia for 3 weeks.  Worsening rib pain. EXAM: PORTABLE CHEST 1 VIEW COMPARISON:  06/08/2012 FINDINGS: Normal cardiac silhouette. There is new airspace disease in the RIGHT lower lobe. LEFT lung is clear. No pneumothorax. IMPRESSION: RIGHT lower lobe lobar pneumonia. Followup PA and lateral chest X-ray is recommended in 3-4 weeks following trial of antibiotic therapy to ensure resolution and  exclude underlying malignancy. Electronically Signed   By: Suzy Bouchard M.D.   On: 12/15/2014 21:28    EKG:NEW LBBB CXR: Moderate infiltrate of the R lung.  ASSESSMENT / PLAN:  Active Problems:   Sepsis ([place])   Pneumonia   PULMONARY A: Hypoxic Respiratory Failure- r/o cardiogenic in nature P:   pcxr in am  Neg balance as able, see renal abg last reviewed, reduce MV vent Control high rr by controlling agitation  CARDIOVASCULAR A: NSTEMI Hypotension, r/o cardiogenic shock AS Severe CAD P:   To cath lab , agree consider swan Neo to map goal, Avoid beta agonists with AS Diona Fanti will be preload dependent with AS, with caution neg balance , pending pressors needs Get cortisol Heparin is off cvp trends assess wedge, CI  RENAL A: ESRD on HD P:   Renal to consider neg balance , pending vent needs assessment Chem in am   GASTROINTESTINAL A: NPO, shock liver P:   start TF  Post cath ppi lft in am   HEMATOLOGIC A: Oozing line, at risk DVT P:   Hep drip off Sub q hep hold until oozing improved scd May need ddavp, avoid with cad as able  INFECTIOUS A: R/o PNA rt, r/o UTI P:   Pip-tazo+vanc for now assess BC  ENDOCRINE A: R/o ai P:   Get cortisol ssi  NEUROLOGIC A: Vent dyschrony, agitation  P:   Shock, dc prop, add fent drip, int versed wua in am   Ccm time 60 min   Lavon Paganini. Titus Mould, MD, [place] Pgr: [facility]   12/20/2014, 3:55 PM

## 2015-01-01 NOTE — Consult Note (Addendum)
CARDIOLOGY CONSULT NOTE  Patient ID: Charles Zuniga MRN: [id number] DOB/AGE: 06-15-1945 69 y.o.  Admit date: 12/18/2014 Referring Physician  Nadara Mode, MD Primary Physician:  Alonza Bogus, MD Reason for Consultation  LBBB, Chest pain and abnormal tropoinin  HPI: Charles Zuniga  is a 69 y.o. male  With known CAD and h/o Coronary angiography 03/17/2013: Mid LAD 60-70% stenosis, not hemodynamically significant by FFR.  PTCA and stenting of the mid circumflex coronary artery with a 3.5 x 16 mm Promus Premier drug-eluting stent. He was seen at [facility], and was in acute respiratory distress, new onset left bundle branch block, pleuritic-type of chest pain, hence pulmonary embolism was ruled out by doing CT angiogram of the chest, however he did show possibility of a pneumonia versus atypical CHF.  He is now at [facility] intensive care unit, troponin has jumped from 7-60, continues to be hypotensive needing Neo-Synephrine. Still states it has mild chest discomfort. Although intubated he is able to converse. States that recently he has noticed worsening dyspnea and also chest pain which is nitrate responsive. Patient states that the present chest pain does not remind him of his angina pectoris and is constant on the left side of the chest.  I had seen him about 4-5 months ago where he had complained of very typical progressive angina pectoris, had recommended coronary angiography at that time, patient stated that with increasing beta blocker therapy he had felt well and would like to wait. Recently his anginal symptoms of progressed.  History is limited due to patient being intubated but is appropriate and answers appropriately. Denies any other symptoms. Denies any fever. States that during dialysis he has had some chest pain but has not had any hypotension. No syncope until prior to presentation he did have a fall due to feeling generally weak, lightheaded and nauseated.  Past Medical  History  Diagnosis Date  . Hypertension   . Gout     occasional  . Diverticulitis   . Mass on back     "fatty tumor"  . GERD (gastroesophageal reflux disease)   . Heart murmur   . High cholesterol   . History of stomach ulcers     "once"  . Stroke 1980's?    denies residual on 03/17/2013  . Anxiety   . Renal disorder   . CKD (chronic kidney disease) stage 4, GFR 15-29 ml/min     "fistula ready; not on dialysis yet" (03/17/2013)     Past Surgical History  Procedure Laterality Date  . Kidney surgery  1980's    "took growth off it" (2/10/2015_  . Av fistula placement Left 11/2011    upper arm  . Colonoscopy with esophagogastroduodenoscopy (egd) N/A 05/27/2012    Procedure: COLONOSCOPY WITH ESOPHAGOGASTRODUODENOSCOPY (EGD);  Surgeon: Rogene Houston, MD;  Location: [facility] ENDO SUITE;  Service: Endoscopy;  Laterality: N/A;  100-moved to 115 Ann to notify pt  . Coronary angioplasty with stent placement  03/17/2013    "1"  . Left heart catheterization with coronary angiogram N/A 03/17/2013    Procedure: LEFT HEART CATHETERIZATION WITH CORONARY ANGIOGRAM;  Surgeon: Laverda Page, MD;  Location: [facility] CATH LAB;  Service: Cardiovascular;  Laterality: N/A;     Family history:  No premature CAD or DM  Social History: Social History   Social History  . Marital Status: Divorced    Spouse Name: N/A  . Number of Children: N/A  . Years of Education: N/A   Occupational History  .  Not on file.   Social History Main Topics  . Smoking status: Never Smoker   . Smokeless tobacco: Never Used  . Alcohol Use: No  . Drug Use: No  . Sexual Activity: Not Currently   Other Topics Concern  . Not on file   Social History Narrative     Prescriptions prior to admission  Medication Sig Dispense Refill Last Dose  . amLODipine (NORVASC) 10 MG tablet Take 10 mg by mouth daily.    Past Month at Unknown time  . aspirin EC 81 MG tablet Take 81 mg by mouth daily.   Past Month at Unknown time  .  cholecalciferol (VITAMIN D) 1000 UNITS tablet Take 1,000 Units by mouth daily.   Past Month at Unknown time  . furosemide (LASIX) 40 MG tablet Take 40 mg by mouth daily.   Past Month at Unknown time  . hydrALAZINE (APRESOLINE) 25 MG tablet Take 25 mg by mouth 3 (three) times daily.    Past Month at Unknown time  . isosorbide mononitrate (IMDUR) 120 MG 24 hr tablet Take 120 mg by mouth daily.   Past Month at Unknown time  . metoprolol succinate (TOPROL-XL) 100 MG 24 hr tablet Take 100 mg by mouth daily.    Past Month at Unknown time  . Multiple Vitamin (DAILY VITE PO) Take 1 tablet by mouth daily.   Past Month at Unknown time  . nitroGLYCERIN (NITROSTAT) 0.4 MG SL tablet Place 0.4 mg under the tongue every 5 (five) minutes as needed for chest pain.   12/23/2014 at Unknown time  . pantoprazole (PROTONIX) 40 MG tablet Take 40 mg by mouth every morning.   Past Month at Unknown time  . sevelamer carbonate (RENVELA) 800 MG tablet Take 800-1,600 mg by mouth See admin instructions. Two tabs 3 times daily wihe meals and one tab twice daily with snacks   Past Month at Unknown time  . sodium bicarbonate 650 MG tablet Take 650 mg by mouth 2 (two) times daily.   Past Month at Unknown time  . acetaminophen (TYLENOL) 500 MG tablet Take 500 mg by mouth every 6 (six) hours as needed for moderate pain.      Marland Kitchen atorvastatin (LIPITOR) 40 MG tablet Take 40 mg by mouth daily.    06/08/2012 at Unknown  . clopidogrel (PLAVIX) 75 MG tablet      . losartan (COZAAR) 100 MG tablet Take 1 tablet (100 mg total) by mouth daily.     Marland Kitchen spironolactone (ALDACTONE) 25 MG tablet Take 1 tablet (25 mg total) by mouth daily.     . Ticagrelor (BRILINTA) 90 MG TABS tablet Take 1 tablet (90 mg total) by mouth 2 (two) times daily. 60 tablet 0      ROS: Limited due to intubation General: no fevers/chills/night sweats Eyes: no blurry vision, diplopia, or amaurosis ENT: no sore throat or hearing loss Resp:Has noticed worsening dyspnea  lately CV: no edema or palpitations. Chest pain since last evening GI: H/O nausea, vomiting, No diarrhea, or constipation GU: no dysuria, frequency, or hematuria Skin: no rash Neuro: no headache, numbness, tingling, or weakness of extremities   Physical Exam: Blood pressure 95/65, pulse 78, temperature 99.9 F (37.7 C), temperature source Core (Comment), resp. rate 11, height 5\' 9"  (1.753 m), weight 67.2 kg (148 lb 2.4 oz), SpO2 95 %.   General appearance: alert, cooperative, appears stated age, mild distress and intubated. Lungs: clear to auscultation bilaterally Heart: Distant heart sounds, continuous hum from AV  shunt in the left arm, transmitted sounds heard over the precordium. Abdomen: soft, non-tender; bowel sounds normal; no masses,  no organomegaly Extremities: extremities normal, atraumatic, no cyanosis or edema Pulses: Unable to evaluate JVD, soft bilateral carotid bruit present, femoral pulses 2+, popliteal pulse faint, pedal pulses absent. No evidence of acute arterial insufficiency. Neurologic: Grossly normal Patient is intubated but able to follow commands.  Labs:   Lab Results  Component Value Date   WBC 17.4* 12/22/2014   HGB 12.0* 12/13/2014   HCT 36.9* 12/14/2014   MCV 100.0 12/13/2014   PLT 262 12/31/2014    Recent Labs Lab 12/14/2014 0414  NA 137  K 4.3  CL 100*  CO2 22  BUN 26*  CREATININE 5.40*  CALCIUM 8.0*  PROT 6.7  BILITOT 1.2  ALKPHOS 58  ALT 27  AST 160*  GLUCOSE 152*   Recent Labs  12/11/2014 2059 01/03/2015 0414  TROPONINI 6.60* 60.89*    Lab Results  Component Value Date   TROPONINI 60.89* 12/14/2014    EKG: 12/22/2014 and 01/01/2015: LBBB. No further analysis. New since prior EKG 03/18/2013.  Echocardiogram 06/02/2014:(office) 1. Left ventricle cavity is normal in size. Mild concentric hypertrophy of the left ventricle. Normal global wall motion. Doppler evidence of grade II (pseudonormal) diastolic dysfunction. Calculated EF 63%.   2. Left atrial cavity is moderately dilated.  3. Mild to moderate aortic regurgitation. Severe calcification of the aortic valve annulus. Severe aortic valve leaflet calcification. Left and non coronary cusps appear to be fused. Moderate to Severely restricted aortic valve leaflets. Severe aortic valve stenosis with approx. peak gradient 76, mean gradient 39 mm of Hg, and valve area 0.89 cm2.  4. Mild mitral regurgitation. Mild tricuspid regurgitation. Mild pulmonary hypertension with approx. PA syst. pressure of 36 mm of Hg.  5. No significant change in severity of Aortic Stenosis since echo. of 11/17/2013.  Carotid artery duplex 10/13/2013: No evidence of hemodynamically significant stenosis in the bilateral carotid bifurcation vessels. There is evidence of homogeneous plaque in the bilateral carotid artery.    Radiology: Ct Angio Chest Pe W/cm &/or Wo Cm  12/15/2014  CLINICAL DATA:  Status post fall onto back. Dizziness, nausea and vomiting. Productive cough and left rib pain. Initial encounter. EXAM: CT ANGIOGRAPHY CHEST WITH CONTRAST TECHNIQUE: Multidetector CT imaging of the chest was performed using the standard protocol during bolus administration of intravenous contrast. Multiplanar CT image reconstructions and MIPs were obtained to evaluate the vascular anatomy. CONTRAST:  190mL OMNIPAQUE IOHEXOL 350 MG/ML SOLN COMPARISON:  Chest radiograph performed earlier today at 9:12 p.m. FINDINGS: There is no evidence of pulmonary embolus. Fluffy airspace opacification is noted within much of the right lung, and minimally within the left lung, with trace bilateral effusions. This may reflect asymmetric pulmonary edema or pneumonia. There is no evidence of pneumothorax. No masses are identified; no abnormal focal contrast enhancement is seen. Scattered coronary artery calcifications are seen. A moderate to large hiatal hernia is noted. A mildly prominent 1.3 cm azygoesophageal recess node is seen. A 1.2 cm  right paratracheal node is noted. No pericardial effusion is identified. The great vessels are grossly unremarkable in appearance. No axillary lymphadenopathy is seen. The thyroid gland is unremarkable in appearance. The visualized portions of the liver and spleen are unremarkable. The visualized portions of the pancreas, gallbladder, stomach, adrenal glands and kidneys are within normal limits. No acute osseous abnormalities are seen. Review of the MIP images confirms the above findings. IMPRESSION: 1. No evidence of  pulmonary embolus. 2. Fluffy airspace opacification within much of the right lung, and minimally within the left lung, with trace bilateral pleural effusions. This may reflect asymmetric pulmonary edema or pneumonia. 3. Scattered coronary artery calcifications seen. 4. Moderate to large hiatal hernia noted. 5. Mildly prominent mediastinal nodes seen. Electronically Signed   By: Garald Balding M.D.   On: 12/08/2014 21:58   Dg Chest Port 1 View  12/26/2014  CLINICAL DATA:  Septic shock, acute onset.  Initial encounter. EXAM: PORTABLE CHEST 1 VIEW COMPARISON:  Chest radiograph performed 12/16/2014 FINDINGS: The patient's endotracheal tube is seen ending 4 cm above the carina. An enteric tube is noted extending below the diaphragm. A right IJ line is noted ending about the cavoatrial junction. Bilateral airspace opacification is again noted, right greater than left, mildly worsened from the prior study. This is concerning for multifocal pneumonia, though asymmetric interstitial edema might have a similar appearance. No definite pleural effusion or pneumothorax is seen. The cardiomediastinal silhouette is borderline normal in size. No acute osseous abnormalities are identified. IMPRESSION: 1. Endotracheal tube seen ending 4 cm above the carina. 2. Bilateral airspace opacification, right greater than left, mildly worsened from the prior study. This is concerning for multifocal pneumonia, though  asymmetric interstitial edema might have a similar appearance. Electronically Signed   By: Garald Balding M.D.   On: 12/23/2014 06:01   Dg Chest Portable 1 View  12/21/2014  CLINICAL DATA:  Post intubation. History of stroke, hypertension, heart murmur, left heart catheterization, chronic kidney disease, renal disorder. EXAM: PORTABLE CHEST 1 VIEW COMPARISON:  CT chest 12/22/2014.  Chest 12/24/2014. FINDINGS: Interval placement of an endotracheal tube with tip measuring 3.7 cm above the carina. Enteric tube is been placed with tip in the left upper quadrant consistent with location in the upper stomach. Persistent diffuse airspace disease in the right lung and in the left lower lung. Esophageal hiatal hernia behind the heart. Normal heart size and pulmonary vascularity. IMPRESSION: Endotracheal tube placed with tip measuring 3.7 cm above the carina. Persistent bilateral airspace disease in the lungs. Electronically Signed   By: Lucienne Capers M.D.   On: 12/16/2014 23:26   Dg Chest Portable 1 View  12/28/2014  CLINICAL DATA:  Hypoxemia for 3 weeks.  Worsening rib pain. EXAM: PORTABLE CHEST 1 VIEW COMPARISON:  06/08/2012 FINDINGS: Normal cardiac silhouette. There is new airspace disease in the RIGHT lower lobe. LEFT lung is clear. No pneumothorax. IMPRESSION: RIGHT lower lobe lobar pneumonia. Followup PA and lateral chest X-ray is recommended in 3-4 weeks following trial of antibiotic therapy to ensure resolution and exclude underlying malignancy. Electronically Signed   By: Suzy Bouchard M.D.   On: 12/17/2014 21:28    Scheduled Meds: . antiseptic oral rinse  7 mL Mouth Rinse QID  . chlorhexidine gluconate  15 mL Mouth Rinse BID  . pantoprazole  20 mg Oral Daily  . piperacillin-tazobactam (ZOSYN)  IV  2.25 g Intravenous 3 times per day  . sodium chloride      . ticagrelor  90 mg Oral BID  . vancomycin  500 mg Intravenous Once  . [START ON 01/03/2015] vancomycin  750 mg Intravenous Q M,W,F-HD    Continuous Infusions: . heparin 950 Units/hr (12/29/2014 2226)  . phenylephrine (NEO-SYNEPHRINE) Adult infusion 30 mcg/min (12/27/2014 0507)  . propofol (DIPRIVAN) infusion Stopped (12/22/2014 0405)   PRN Meds:.sodium chloride  ASSESSMENT AND PLAN:  1. Respiratory arrest 2. Shock, probably related to cardiogenic shock with atypical presentation of pulmonary  edema, however chest pain suggestive of pleuritic chest pain and minimally elevated white count we also suggest underlying pneumonia. Patient does not appear to be toxic. 3. Known coronary artery disease with history of stenting to the circumflex coronary artery and intermediate lesion in the LAD 60-70%. Recent acceleration in dyspnea and also angina pectoris. History of DES, 3.5 x 16 mm Promus Premier DES placed on 03/17/2013. 4. Moderate to severe aortic stenosis with mean gradient around 32-33 mmHg unchanged from 2015 by outpatient echocardiogram. 5. End-stage renal disease on hemodialysis via left arm AV shunt.  Recommendation: Due to increasing serum troponin, continued chest discomfort, hypotension and need for pressor therapy, will proceed with coronary angiography on an urgent basis. Patient agrees to proceed. We'll also perform right heart catheterization to evaluate volume status. Extremely ill  and critically sick patient, 60 minute evaluation of complex medical issues in addition making. D/W Dr. Nadara Mode.  Adrian Prows, MD 12/14/2014, 6:54 AM [facility] [place] Pager: [phone] Office: [phone] If no answer Cell [phone]

## 2015-01-01 NOTE — Progress Notes (Signed)
Pt transported to cath lab, no complications.

## 2015-01-01 NOTE — Procedures (Signed)
Central Venous Catheter Insertion Procedure Note Charles Zuniga [id number] 1945/11/24  Procedure: Insertion of Central Venous Catheter Indications: Assessment of intravascular volume, Drug and/or fluid administration and Frequent blood sampling  Procedure Details Consent: Unable to obtain consent because of lack of availble proxy. Verbal consent obtained from patient who was awake and able to communicate.. Time Out: Verified patient identification, verified procedure, site/side was marked, verified correct patient position, special equipment/implants available, medications/allergies/relevent history reviewed, required imaging and test results available.  Performed  Maximum sterile technique was used including antiseptics, cap, gloves, gown, hand hygiene, mask and sheet. Skin prep: Chlorhexidine; local anesthetic administered A antimicrobial bonded/coated triple lumen catheter was placed in the right internal jugular vein using the Seldinger technique.  Evaluation Blood flow good Complications: No apparent complications Patient did tolerate procedure well. Chest X-ray ordered to verify placement.  CXR: normal placement of catheter.  Charles Zuniga 12/21/2014, 5:57 AM

## 2015-01-01 NOTE — H&P (Signed)
PULMONARY / CRITICAL CARE MEDICINE HISTORY AND PHYSICAL EXAMINATION   Name: Charles Zuniga MRN: [id number] DOB: 21-Oct-1945    ADMISSION DATE:  01/05/2015  PRIMARY SERVICE: PCCM  CHIEF COMPLAINT:  Weakness, chest pain  BRIEF PATIENT DESCRIPTION: 69 y/o man with ESRD with pneumonia and septic shock  SIGNIFICANT EVENTS / STUDIES:  CT-PA showing no PE, but infiltrate on R.  LINES / TUBES: PIVs ETT 11/25>> NGT11/25>>  CULTURES: Blood cx 11/25>> Urine cx 11/25>> Lower resp cx 11/25>>  ANTIBIOTICS: Vanc: 11/25>> Pip-tazo: 11/25>>  HISTORY OF PRESENT ILLNESS:   69 y/o man with ESRD on HD, CAD who was brought to the ED at [facility] after developing malaise, lightheadedness, and nausea after dialysis the day prior to admission. Following dialysis, he felt weak, and had a fall at home. He vomited and developed a cough, pleuritic chest pain, dyspnea, and decreased appetite. He presented to the ED with mild respiratory distress with new EKG findings (LBBB), and chest imaging consistent with pneumonia. He did have a respiratory alkalosis with increasing WOB, so the decision was made to intubate him. He was transferred to [facility] for further management.  PAST MEDICAL HISTORY :  Past Medical History  Diagnosis Date  . Hypertension   . Gout     occasional  . Diverticulitis   . Mass on back     "fatty tumor"  . GERD (gastroesophageal reflux disease)   . Heart murmur   . High cholesterol   . History of stomach ulcers     "once"  . Stroke 1980's?    denies residual on 03/17/2013  . Anxiety   . Renal disorder   . CKD (chronic kidney disease) stage 4, GFR 15-29 ml/min     "fistula ready; not on dialysis yet" (03/17/2013)   Past Surgical History  Procedure Laterality Date  . Kidney surgery  1980's    "took growth off it" (2/10/2015_  . Av fistula placement Left 11/2011    upper arm  . Colonoscopy with esophagogastroduodenoscopy (egd) N/A 05/27/2012    Procedure:  COLONOSCOPY WITH ESOPHAGOGASTRODUODENOSCOPY (EGD);  Surgeon: Rogene Houston, MD;  Location: [facility] ENDO SUITE;  Service: Endoscopy;  Laterality: N/A;  100-moved to 115 Ann to notify pt  . Coronary angioplasty with stent placement  03/17/2013    "1"  . Left heart catheterization with coronary angiogram N/A 03/17/2013    Procedure: LEFT HEART CATHETERIZATION WITH CORONARY ANGIOGRAM;  Surgeon: Laverda Page, MD;  Location: [facility] CATH LAB;  Service: Cardiovascular;  Laterality: N/A;   Prior to Admission medications   Medication Sig Start Date End Date Taking? Authorizing Provider  amLODipine (NORVASC) 10 MG tablet Take 10 mg by mouth daily.  09/29/14  Yes Historical Provider, MD  aspirin EC 81 MG tablet Take 81 mg by mouth daily.   Yes Historical Provider, MD  cholecalciferol (VITAMIN D) 1000 UNITS tablet Take 1,000 Units by mouth daily.   Yes Historical Provider, MD  furosemide (LASIX) 40 MG tablet Take 40 mg by mouth daily.   Yes Historical Provider, MD  hydrALAZINE (APRESOLINE) 25 MG tablet Take 25 mg by mouth 3 (three) times daily.  12/29/14  Yes Historical Provider, MD  isosorbide mononitrate (IMDUR) 120 MG 24 hr tablet Take 120 mg by mouth daily.   Yes Historical Provider, MD  metoprolol succinate (TOPROL-XL) 100 MG 24 hr tablet Take 100 mg by mouth daily.  10/25/14  Yes Historical Provider, MD  Multiple Vitamin (DAILY VITE PO) Take 1 tablet  by mouth daily.   Yes Historical Provider, MD  nitroGLYCERIN (NITROSTAT) 0.4 MG SL tablet Place 0.4 mg under the tongue every 5 (five) minutes as needed for chest pain.   Yes Historical Provider, MD  pantoprazole (PROTONIX) 40 MG tablet Take 40 mg by mouth every morning.   Yes Historical Provider, MD  sevelamer carbonate (RENVELA) 800 MG tablet Take 800-1,600 mg by mouth See admin instructions. Two tabs 3 times daily wihe meals and one tab twice daily with snacks   Yes Historical Provider, MD  sodium bicarbonate 650 MG tablet Take 650 mg by mouth 2 (two) times  daily.   Yes Historical Provider, MD  acetaminophen (TYLENOL) 500 MG tablet Take 500 mg by mouth every 6 (six) hours as needed for moderate pain.     Historical Provider, MD  atorvastatin (LIPITOR) 40 MG tablet Take 40 mg by mouth daily.  05/06/12   Historical Provider, MD  clopidogrel (PLAVIX) 75 MG tablet  10/25/14   Historical Provider, MD  losartan (COZAAR) 100 MG tablet Take 1 tablet (100 mg total) by mouth daily. 03/19/13   Adrian Prows, MD  spironolactone (ALDACTONE) 25 MG tablet Take 1 tablet (25 mg total) by mouth daily. 03/19/13   Adrian Prows, MD  Ticagrelor (BRILINTA) 90 MG TABS tablet Take 1 tablet (90 mg total) by mouth 2 (two) times daily. 03/18/13   Adrian Prows, MD   Allergies  Allergen Reactions  . Clonidine Derivatives Other (See Comments)    Hallucinations, anxious,insomnia    FAMILY HISTORY:  No family history on file. SOCIAL HISTORY:  reports that he has never smoked. He has never used smokeless tobacco. He reports that he does not drink alcohol or use illicit drugs.  REVIEW OF SYSTEMS:  Unable to obtain 2/2 intubation  SUBJECTIVE:   VITAL SIGNS: Temp:  [98.6 F (37 C)-100.4 F (38 C)] 99.3 F (37.4 C) (11/26 0300) Pulse Rate:  [66-120] 66 (11/26 0358) Resp:  [15-33] 20 (11/26 0358) BP: (81-159)/(61-118) 84/61 mmHg (11/26 0358) SpO2:  [82 %-100 %] 100 % (11/26 0358) FiO2 (%):  [40 %-100 %] 40 % (11/26 0359) Weight:  [148 lb 2.4 oz (67.2 kg)-150 lb (68.04 kg)] 148 lb 2.4 oz (67.2 kg) (11/26 0230) HEMODYNAMICS:   VENTILATOR SETTINGS: Vent Mode:  [-] PRVC FiO2 (%):  [40 %-100 %] 40 % Set Rate:  [18 bmp-24 bmp] 18 bmp Vt Set:  [480 mL] 480 mL PEEP:  [5 cmH20-12 cmH20] 5 cmH20 Plateau Pressure:  [15 [id number] cmH20] 15 cmH20 INTAKE / OUTPUT: Intake/Output      11/25 0701 - 11/26 0700   I.V. (mL/kg) 1065.7 (15.9)   Total Intake(mL/kg) 1065.7 (15.9)   Urine (mL/kg/hr) 300   Total Output 300   Net +765.7         PHYSICAL EXAMINATION: General:  Elderly man  intubated lying in bed Neuro:  Asleep, but able to arouse to stimulation and respond to simple quiestions HEENT:  Dry MM Neck: No masses Cardiovascular:  Regular rate, rhythm Lungs:  CTA Abdomen:  Soft, nontender Musculoskeletal:  No swollen joints Skin:  No rashes  LABS:  CBC  Recent Labs Lab 01/04/2015 2059 12/16/2014 2107  WBC 18.5*  --   HGB 12.9* 13.6  HCT 38.3* 40.0  PLT 319  --    Coag's No results for input(s): APTT, INR in the last 168 hours. BMET  Recent Labs Lab 12/10/2014 2059 12/09/2014 2107  NA 138 139  K 4.3 4.2  CL 97* 98*  CO2 25  --   BUN 22* 21*  CREATININE 5.05* 5.00*  GLUCOSE 141* 142*   Electrolytes  Recent Labs Lab 12/22/2014 2016 12/22/2014 2059  CALCIUM  --  8.9  MG 1.9  --    Sepsis Markers  Recent Labs Lab 12/12/2014 2107 12/29/2014 0036  LATICACIDVEN 3.74* 4.83*   ABG  Recent Labs Lab 12/21/2014 2109 12/16/2014 2325 12/28/2014 0400  PHART 7.531* 7.388 7.492*  PCO2ART 29.5* 34.7* 27.6*  PO2ART 70.6* 111.0* 65.5*   Liver Enzymes No results for input(s): AST, ALT, ALKPHOS, BILITOT, ALBUMIN in the last 168 hours. Cardiac Enzymes  Recent Labs Lab 12/23/2014 2059  TROPONINI 6.60*   Glucose  Recent Labs Lab 12/13/2014 0228  GLUCAP 184*    Imaging Ct Angio Chest Pe W/cm &/or Wo Cm  12/30/2014  CLINICAL DATA:  Status post fall onto back. Dizziness, nausea and vomiting. Productive cough and left rib pain. Initial encounter. EXAM: CT ANGIOGRAPHY CHEST WITH CONTRAST TECHNIQUE: Multidetector CT imaging of the chest was performed using the standard protocol during bolus administration of intravenous contrast. Multiplanar CT image reconstructions and MIPs were obtained to evaluate the vascular anatomy. CONTRAST:  156mL OMNIPAQUE IOHEXOL 350 MG/ML SOLN COMPARISON:  Chest radiograph performed earlier today at 9:12 p.m. FINDINGS: There is no evidence of pulmonary embolus. Fluffy airspace opacification is noted within much of the right lung, and  minimally within the left lung, with trace bilateral effusions. This may reflect asymmetric pulmonary edema or pneumonia. There is no evidence of pneumothorax. No masses are identified; no abnormal focal contrast enhancement is seen. Scattered coronary artery calcifications are seen. A moderate to large hiatal hernia is noted. A mildly prominent 1.3 cm azygoesophageal recess node is seen. A 1.2 cm right paratracheal node is noted. No pericardial effusion is identified. The great vessels are grossly unremarkable in appearance. No axillary lymphadenopathy is seen. The thyroid gland is unremarkable in appearance. The visualized portions of the liver and spleen are unremarkable. The visualized portions of the pancreas, gallbladder, stomach, adrenal glands and kidneys are within normal limits. No acute osseous abnormalities are seen. Review of the MIP images confirms the above findings. IMPRESSION: 1. No evidence of pulmonary embolus. 2. Fluffy airspace opacification within much of the right lung, and minimally within the left lung, with trace bilateral pleural effusions. This may reflect asymmetric pulmonary edema or pneumonia. 3. Scattered coronary artery calcifications seen. 4. Moderate to large hiatal hernia noted. 5. Mildly prominent mediastinal nodes seen. Electronically Signed   By: Garald Balding M.D.   On: 01/03/2015 21:58   Dg Chest Portable 1 View  12/07/2014  CLINICAL DATA:  Post intubation. History of stroke, hypertension, heart murmur, left heart catheterization, chronic kidney disease, renal disorder. EXAM: PORTABLE CHEST 1 VIEW COMPARISON:  CT chest 12/24/2014.  Chest 12/25/2014. FINDINGS: Interval placement of an endotracheal tube with tip measuring 3.7 cm above the carina. Enteric tube is been placed with tip in the left upper quadrant consistent with location in the upper stomach. Persistent diffuse airspace disease in the right lung and in the left lower lung. Esophageal hiatal hernia behind the  heart. Normal heart size and pulmonary vascularity. IMPRESSION: Endotracheal tube placed with tip measuring 3.7 cm above the carina. Persistent bilateral airspace disease in the lungs. Electronically Signed   By: Lucienne Capers M.D.   On: 12/08/2014 23:26   Dg Chest Portable 1 View  01/05/2015  CLINICAL DATA:  Hypoxemia for 3 weeks.  Worsening rib pain. EXAM: PORTABLE CHEST 1 VIEW  COMPARISON:  06/08/2012 FINDINGS: Normal cardiac silhouette. There is new airspace disease in the RIGHT lower lobe. LEFT lung is clear. No pneumothorax. IMPRESSION: RIGHT lower lobe lobar pneumonia. Followup PA and lateral chest X-ray is recommended in 3-4 weeks following trial of antibiotic therapy to ensure resolution and exclude underlying malignancy. Electronically Signed   By: Suzy Bouchard M.D.   On: 12/30/2014 21:28    EKG: Sinus rhythm, with a moderate amount of ectopy CXR: Moderate infiltrate of the R lung.  ASSESSMENT / PLAN:  Active Problems:   Sepsis ([place])   Pneumonia   PULMONARY A: Sepsis of pulmonary origin Hypoxic Respiratory Failure Respiratory Alkalosis P:   Treat with pip-tazo/vanc. Maintain vent, titrate O2, adjust to maintain  7.3<pH <7.4  CARDIOVASCULAR A: NSTEMI Hypotension due to sepsis. P:   Maintain heparin for now, will give ASA. Need cards c/s. F/u repeat troponin. Will be cautious with pressors of indicated. Hold for now.  RENAL A: ESRD on HD P:   Will need renal c/s tomorrow  GASTROINTESTINAL A: No acute issues P:    HEMATOLOGIC A: No acute issues P:    INFECTIOUS A: Sepsis of pulmonary origin, although urine specimen was quite dirty. Possible UTIas well. P:   Pip-tazo+vanc  ENDOCRINE A: No acute issues P:    NEUROLOGIC A: Need for sedation P:   Propofol as BP will allow.  BEST PRACTICE / DISPOSITION Level of Care:  ICU Primary Service:  PCCM Consultants:  None, will need renal Code Status:  Full Diet:  NPO DVT Px:  Heparin gtt GI Px:   Will need PPI Skin Integrity:  rashes Social / Family:  Need to be updated  TODAY'S SUMMARY: 69 y/o man with NSTEMI and sepsis of pulmonary origin.  I have personally obtained a history, examined the patient, evaluated laboratory and imaging results, formulated the assessment and plan and placed orders.  CRITICAL CARE: The patient is critically ill with multiple organ systems failure and requires high complexity decision making for assessment and support, frequent evaluation and titration of therapies, application of advanced monitoring technologies and extensive interpretation of multiple databases. Critical Care Time devoted to patient care services described in this note is 45 miinutes.   Luz Brazen, MD Pulmonary and [place] Pager: [phone]   01/04/2015, 4:11 AM

## 2015-01-01 NOTE — Progress Notes (Signed)
[place] for Heparin Indication: chest pain/ACS  Allergies  Allergen Reactions  . Clonidine Derivatives Other (See Comments)    Hallucinations, anxious,insomnia    Patient Measurements: Height: 5\' 9"  (175.3 cm) Weight: 148 lb 2.4 oz (67.2 kg) IBW/kg (Calculated) : 70.7  Vital Signs: Temp: 99.5 F (37.5 C) (11/26 0530) Temp Source: Core (Comment) (11/26 0400) BP: 97/73 mmHg (11/26 0530) Pulse Rate: 79 (11/26 0530)  Labs:  Recent Labs  12/17/2014 2059 12/18/2014 2107 12/26/2014 0414  HGB 12.9* 13.6 12.0*  HCT 38.3* 40.0 36.9*  PLT 319  --  262  HEPARINUNFRC  --   --  0.81*  CREATININE 5.05* 5.00* 5.40*  TROPONINI 6.60*  --  60.89*    Estimated Creatinine Clearance: 12.1 mL/min (by C-G formula based on Cr of 5.4).   Medical History: Past Medical History  Diagnosis Date  . Hypertension   . Gout     occasional  . Diverticulitis   . Mass on back     "fatty tumor"  . GERD (gastroesophageal reflux disease)   . Heart murmur   . High cholesterol   . History of stomach ulcers     "once"  . Stroke 1980's?    denies residual on 03/17/2013  . Anxiety   . Renal disorder   . CKD (chronic kidney disease) stage 4, GFR 15-29 ml/min     "fistula ready; not on dialysis yet" (03/17/2013)    Medications:  Prescriptions prior to admission  Medication Sig Dispense Refill Last Dose  . amLODipine (NORVASC) 10 MG tablet Take 10 mg by mouth daily.    Past Month at Unknown time  . aspirin EC 81 MG tablet Take 81 mg by mouth daily.   Past Month at Unknown time  . cholecalciferol (VITAMIN D) 1000 UNITS tablet Take 1,000 Units by mouth daily.   Past Month at Unknown time  . furosemide (LASIX) 40 MG tablet Take 40 mg by mouth daily.   Past Month at Unknown time  . hydrALAZINE (APRESOLINE) 25 MG tablet Take 25 mg by mouth 3 (three) times daily.    Past Month at Unknown time  . isosorbide mononitrate (IMDUR) 120 MG 24 hr tablet Take 120 mg by mouth  daily.   Past Month at Unknown time  . metoprolol succinate (TOPROL-XL) 100 MG 24 hr tablet Take 100 mg by mouth daily.    Past Month at Unknown time  . Multiple Vitamin (DAILY VITE PO) Take 1 tablet by mouth daily.   Past Month at Unknown time  . nitroGLYCERIN (NITROSTAT) 0.4 MG SL tablet Place 0.4 mg under the tongue every 5 (five) minutes as needed for chest pain.   12/16/2014 at Unknown time  . pantoprazole (PROTONIX) 40 MG tablet Take 40 mg by mouth every morning.   Past Month at Unknown time  . sevelamer carbonate (RENVELA) 800 MG tablet Take 800-1,600 mg by mouth See admin instructions. Two tabs 3 times daily wihe meals and one tab twice daily with snacks   Past Month at Unknown time  . sodium bicarbonate 650 MG tablet Take 650 mg by mouth 2 (two) times daily.   Past Month at Unknown time  . acetaminophen (TYLENOL) 500 MG tablet Take 500 mg by mouth every 6 (six) hours as needed for moderate pain.      Marland Kitchen atorvastatin (LIPITOR) 40 MG tablet Take 40 mg by mouth daily.    06/08/2012 at Unknown  . clopidogrel (PLAVIX) 75 MG tablet      .  losartan (COZAAR) 100 MG tablet Take 1 tablet (100 mg total) by mouth daily.     Marland Kitchen spironolactone (ALDACTONE) 25 MG tablet Take 1 tablet (25 mg total) by mouth daily.     . Ticagrelor (BRILINTA) 90 MG TABS tablet Take 1 tablet (90 mg total) by mouth 2 (two) times daily. 60 tablet 0     Assessment: 69 y.o. male with VDRF/sepsis, chest pain and elevated cardiac markers, for heparin.  Heparin 4000 units IV bolus, 950 units/hr started at [facility] at 2230.    Goal of Therapy:  Heparin level 0.3-0.7 units/ml Monitor platelets by anticoagulation protocol: Yes   Plan:  Expect heparin level to decrease with additional time following initial bolus, so will continue heparin at current rate for now. Recheck level in 6 hrs to verify.  Cebastian Neis, Bronson Curb 01/04/2015,5:59 AM

## 2015-01-01 NOTE — Consult Note (Signed)
Reason for Consult:ESRD Referring Physician: Dr. Micki Riley is an 69 y.o. male.  HPI: 69[place] after feeling bad at home with N, V, pleuritic Cp, Weakness.  Went to [facility] ED and found to have pneu and new MI and low bps.ENtub here, and went to cath lab, ? What was done.  Now with low bp, on vent.  Had HD yest.   [facility]. Primary Nephrologist [place]. EDW unknown. HD [place] unknown, Dialyzer unknown, Heparin unknown. Access LUA AVF.  Past Medical History  Diagnosis Date  . Hypertension   . Gout     occasional  . Diverticulitis   . Mass on back     "fatty tumor"  . GERD (gastroesophageal reflux disease)   . Heart murmur   . High cholesterol   . History of stomach ulcers     "once"  . Stroke 1980's?    denies residual on 03/17/2013  . Anxiety   . Renal disorder   . CKD (chronic kidney disease) stage 4, GFR 15-29 ml/min     "fistula ready; not on dialysis yet" (03/17/2013)    Past Surgical History  Procedure Laterality Date  . Kidney surgery  1980's    "took growth off it" (2/10/2015_  . Av fistula placement Left 11/2011    upper arm  . Colonoscopy with esophagogastroduodenoscopy (egd) N/A 05/27/2012    Procedure: COLONOSCOPY WITH ESOPHAGOGASTRODUODENOSCOPY (EGD);  Surgeon: NRogene Houston MD;  Location: [facility];  Service: Endoscopy;  Laterality: N/A;  100-moved to 115 Ann to notify pt  . Coronary angioplasty with stent placement  03/17/2013    "1"  . Left heart catheterization with coronary angiogram N/A 03/17/2013    Procedure: LEFT HEART CATHETERIZATION WITH CORONARY ANGIOGRAM;  Surgeon: JLaverda Page MD;  [facility];  Service: Cardiovascular;  Laterality: N/A;    No family history on file.  Social History:  reports that he has never smoked. He has never used smokeless tobacco. He reports that he does not drink alcohol or use illicit  drugs.  Allergies:  Allergies  Allergen Reactions  . Clonidine Derivatives Other (See Comments)    Hallucinations, anxious,insomnia    Medications:  I have reviewed the patient's current medications. Prior to Admission:    Results for orders placed or performed during the hospital encounter of 12/09/2014 (from the past 48 hour(s))  Magnesium     Status: None   Collection Time: 12/27/2014  8:16 PM  Result Value Ref Range   Magnesium 1.9 1.7 - 2.4 mg/dL  CBC with Differential/Platelet     Status: Abnormal   Collection Time: 01/03/2015  8:59 PM  Result Value Ref Range   WBC 18.5 (H) 4.0 - 10.5 K/uL   RBC 3.96 (L) 4.22 - 5.81 MIL/uL   Hemoglobin 12.9 (L) 13.0 - 17.0 g/dL   HCT 38.3 (L) 39.0 - 52.0 %   MCV 96.7 78.0 - 100.0 fL   MCH 32.6 26.0 - 34.0 pg   MCHC 33.7 30.0 - 36.0 g/dL   RDW 16.2 (H) 11.5 - 15.5 %   Platelets 319 150 - 400 K/uL   Neutrophils Relative % 87 %   Neutro Abs 16.2 (H) 1.7 - 7.7 K/uL   Lymphocytes Relative 3 %   Lymphs Abs 0.5 (L) 0.7 - 4.0 K/uL   Monocytes Relative 10 %   Monocytes Absolute 1.8 (H) 0.1 -  1.0 K/uL   Eosinophils Relative 0 %   Eosinophils Absolute 0.0 0.0 - 0.7 K/uL   Basophils Relative 0 %   Basophils Absolute 0.1 0.0 - 0.1 K/uL  Basic metabolic panel     Status: Abnormal   Collection Time: 12/14/2014  8:59 PM  Result Value Ref Range   Sodium 138 135 - 145 mmol/L   Potassium 4.3 3.5 - 5.1 mmol/L   Chloride 97 (L) 101 - 111 mmol/L   CO2 25 22 - 32 mmol/L   Glucose, Bld 141 (H) 65 - 99 mg/dL   BUN 22 (H) 6 - 20 mg/dL   Creatinine, Ser 5.05 (H) 0.61 - 1.24 mg/dL   Calcium 8.9 8.9 - 10.3 mg/dL   GFR calc non Af Amer 11 (L) >60 mL/min   GFR calc Af Amer 12 (L) >60 mL/min    Comment: (NOTE) The eGFR has been calculated using the CKD EPI equation. This calculation has not been validated in all clinical situations. eGFR's persistently <60 mL/min signify possible Chronic Kidney Disease.    Anion gap 16 (H) 5 - 15  Troponin I     Status:  Abnormal   Collection Time: 01/03/2015  8:59 PM  Result Value Ref Range   Troponin I 6.60 (HH) <0.031 ng/mL    Comment:        POSSIBLE MYOCARDIAL ISCHEMIA. SERIAL TESTING RECOMMENDED. CRITICAL RESULT CALLED TO, READ BACK BY AND VERIFIED WITH: JENNINGS,E ON 12/16/2014 AT 2150 BY LOY,C   I-stat chem 8, ed     Status: Abnormal   Collection Time: 01/05/2015  9:07 PM  Result Value Ref Range   Sodium 139 135 - 145 mmol/L   Potassium 4.2 3.5 - 5.1 mmol/L   Chloride 98 (L) 101 - 111 mmol/L   BUN 21 (H) 6 - 20 mg/dL   Creatinine, Ser 5.00 (H) 0.61 - 1.24 mg/dL   Glucose, Bld 142 (H) 65 - 99 mg/dL   Calcium, Ion 0.97 (L) 1.13 - 1.30 mmol/L   TCO2 24 0 - 100 mmol/L   Hemoglobin 13.6 13.0 - 17.0 g/dL   HCT 40.0 39.0 - 52.0 %  I-Stat CG4 Lactic Acid, ED     Status: Abnormal   Collection Time: 12/16/2014  9:07 PM  Result Value Ref Range   Lactic Acid, Venous 3.74 (HH) 0.5 - 2.0 mmol/L   Comment NOTIFIED PHYSICIAN   Blood gas, arterial ([facility] & [facility] ONLY)     Status: Abnormal   Collection Time: 12/11/2014  9:09 PM  Result Value Ref Range   FIO2 100.00    Mode NON-REBREATHER OXYGEN MASK    pH, Arterial 7.531 (H) 7.350 - 7.450   pCO2 arterial 29.5 (L) 35.0 - 45.0 mmHg   pO2, Arterial 70.6 (L) 80.0 - 100.0 mmHg   Bicarbonate 27.0 (H) 20.0 - 24.0 mEq/L   Acid-Base Excess 2.0 0.0 - 2.0 mmol/L   O2 Saturation 94.3 %   Patient temperature 98.6    Collection site BRACHIAL ARTERY    Drawn by [id number]    Sample type ARTERIAL    Allens test (pass/fail) NOT INDICATED (A) PASS  Blood culture (routine x 2)     Status: None (Preliminary result)   Collection Time: 12/24/2014 10:05 PM  Result Value Ref Range   Specimen Description BLOOD RIGHT HAND    Special Requests BOTTLES DRAWN AEROBIC ONLY 6CC    Culture NO GROWTH < 12 HOURS    Report Status PENDING   Blood culture (routine x  2)     Status: None (Preliminary result)   Collection Time: 12/08/2014 10:11 PM  Result Value Ref Range   Specimen Description BLOOD  RIGHT HAND    Special Requests BOTTLES DRAWN AEROBIC ONLY 6CC    Culture NO GROWTH < 12 HOURS    Report Status PENDING   Blood gas, arterial     Status: Abnormal   Collection Time: 12/15/2014 11:25 PM  Result Value Ref Range   FIO2 100.00    Delivery systems VENTILATOR    Mode PRESSURE REGULATED VOLUME CONTROL    VT 480 mL   LHR 24 resp/min   Peep/cpap 12.0 cm H20   pH, Arterial 7.388 7.350 - 7.450   pCO2 arterial 34.7 (L) 35.0 - 45.0 mmHg   pO2, Arterial 111.0 (H) 80.0 - 100.0 mmHg   Bicarbonate 21.7 20.0 - 24.0 mEq/L   Acid-base deficit 3.7 (H) 0.0 - 2.0 mmol/L   O2 Saturation 97.0 %   Collection site BRACHIAL ARTERY    Drawn by [id number]    Sample type ARTERIAL    Allens test (pass/fail) NOT INDICATED (A) PASS  I-Stat CG4 Lactic Acid, ED     Status: Abnormal   Collection Time: 12/07/2014 12:36 AM  Result Value Ref Range   Lactic Acid, Venous 4.83 (HH) 0.5 - 2.0 mmol/L  Urinalysis, Routine w reflex microscopic (not at [facility])     Status: Abnormal   Collection Time: 12/25/2014 12:38 AM  Result Value Ref Range   Color, Urine YELLOW YELLOW   APPearance CLOUDY (A) CLEAR   Specific Gravity, Urine 1.020 1.005 - 1.030   pH 6.5 5.0 - 8.0   Glucose, UA NEGATIVE NEGATIVE mg/dL   Hgb urine dipstick MODERATE (A) NEGATIVE   Bilirubin Urine NEGATIVE NEGATIVE   Ketones, ur NEGATIVE NEGATIVE mg/dL   Protein, ur >300 (A) NEGATIVE mg/dL   Nitrite POSITIVE (A) NEGATIVE   Leukocytes, UA LARGE (A) NEGATIVE  Urine microscopic-add on     Status: Abnormal   Collection Time: 12/25/2014 12:38 AM  Result Value Ref Range   Squamous Epithelial / LPF 0-5 (A) NONE SEEN    Comment: Please note change in reference range.   WBC, UA TOO NUMEROUS TO COUNT 0 - 5 WBC/hpf    Comment: Please note change in reference range.   RBC / HPF 0-5 0 - 5 RBC/hpf    Comment: Please note change in reference range.   Bacteria, UA MANY (A) NONE SEEN    Comment: Please note change in reference range.  MRSA PCR Screening      Status: None   Collection Time: 12/09/2014  2:23 AM  Result Value Ref Range   MRSA by PCR NEGATIVE NEGATIVE    Comment:        The GeneXpert MRSA Assay (FDA approved for NASAL specimens only), is one component of a comprehensive MRSA colonization surveillance program. It is not intended to diagnose MRSA infection nor to guide or monitor treatment for MRSA infections.   Glucose, capillary     Status: Abnormal   Collection Time: 12/23/2014  2:28 AM  Result Value Ref Range   Glucose-Capillary 184 (H) 65 - 99 mg/dL  Blood gas, arterial     Status: Abnormal   Collection Time: 01/03/2015  4:00 AM  Result Value Ref Range   FIO2 0.40    Delivery systems VENTILATOR    Mode PRESSURE REGULATED VOLUME CONTROL    VT 480 mL   LHR 18 resp/min   Peep/cpap 5.0  cm H20   pH, Arterial 7.492 (H) 7.350 - 7.450   pCO2 arterial 27.6 (L) 35.0 - 45.0 mmHg   pO2, Arterial 65.5 (L) 80.0 - 100.0 mmHg   Bicarbonate 20.9 20.0 - 24.0 mEq/L   TCO2 21.7 0 - 100 mmol/L   Acid-base deficit 2.0 0.0 - 2.0 mmol/L   O2 Saturation 92.9 %   Patient temperature 98.8    Collection site RIGHT RADIAL    Drawn by [id number]    Sample type ARTERIAL DRAW    Allens test (pass/fail) PASS PASS  CBC with Differential/Platelet     Status: Abnormal   Collection Time: 12/31/2014  4:14 AM  Result Value Ref Range   WBC 17.4 (H) 4.0 - 10.5 K/uL   RBC 3.69 (L) 4.22 - 5.81 MIL/uL   Hemoglobin 12.0 (L) 13.0 - 17.0 g/dL   HCT 36.9 (L) 39.0 - 52.0 %   MCV 100.0 78.0 - 100.0 fL   MCH 32.5 26.0 - 34.0 pg   MCHC 32.5 30.0 - 36.0 g/dL   RDW 16.8 (H) 11.5 - 15.5 %   Platelets 262 150 - 400 K/uL   Neutrophils Relative % 84 %   Neutro Abs 14.5 (H) 1.7 - 7.7 K/uL   Lymphocytes Relative 4 %   Lymphs Abs 0.7 0.7 - 4.0 K/uL   Monocytes Relative 12 %   Monocytes Absolute 2.1 (H) 0.1 - 1.0 K/uL   Eosinophils Relative 0 %   Eosinophils Absolute 0.0 0.0 - 0.7 K/uL   Basophils Relative 0 %   Basophils Absolute 0.1 0.0 - 0.1 K/uL  Comprehensive  metabolic panel     Status: Abnormal   Collection Time: 12/11/2014  4:14 AM  Result Value Ref Range   Sodium 137 135 - 145 mmol/L   Potassium 4.3 3.5 - 5.1 mmol/L   Chloride 100 (L) 101 - 111 mmol/L   CO2 22 22 - 32 mmol/L   Glucose, Bld 152 (H) 65 - 99 mg/dL   BUN 26 (H) 6 - 20 mg/dL   Creatinine, Ser 5.40 (H) 0.61 - 1.24 mg/dL   Calcium 8.0 (L) 8.9 - 10.3 mg/dL   Total Protein 6.7 6.5 - 8.1 g/dL   Albumin 2.7 (L) 3.5 - 5.0 g/dL   AST 160 (H) 15 - 41 U/L   ALT 27 17 - 63 U/L   Alkaline Phosphatase 58 38 - 126 U/L   Total Bilirubin 1.2 0.3 - 1.2 mg/dL   GFR calc non Af Amer 10 (L) >60 mL/min   GFR calc Af Amer 11 (L) >60 mL/min    Comment: (NOTE) The eGFR has been calculated using the CKD EPI equation. This calculation has not been validated in all clinical situations. eGFR's persistently <60 mL/min signify possible Chronic Kidney Disease.    Anion gap 15 5 - 15  Troponin I (q 6hr x 3)     Status: Abnormal   Collection Time: 12/15/2014  4:14 AM  Result Value Ref Range   Troponin I 60.89 (HH) <0.031 ng/mL    Comment:        POSSIBLE MYOCARDIAL ISCHEMIA. SERIAL TESTING RECOMMENDED. CRITICAL RESULT CALLED TO, READ BACK BY AND VERIFIED WITH: HOVANDER D,RN 12/29/2014 0556 WAYK   Lactic acid, plasma     Status: Abnormal   Collection Time: 12/16/2014  4:14 AM  Result Value Ref Range   Lactic Acid, Venous 3.8 (HH) 0.5 - 2.0 mmol/L    Comment: CRITICAL RESULT CALLED TO, READ BACK BY AND VERIFIED WITH:  [facility] 12/24/2014 0548 WAYK   Magnesium     Status: Abnormal   Collection Time: 12/17/2014  4:14 AM  Result Value Ref Range   Magnesium 2.5 (H) 1.7 - 2.4 mg/dL  Phosphorus     Status: Abnormal   Collection Time: 12/07/2014  4:14 AM  Result Value Ref Range   Phosphorus 4.8 (H) 2.5 - 4.6 mg/dL  Type and screen If need to transfuse blood products please use the blood administration order set     Status: None   Collection Time: 12/26/2014  4:14 AM  Result Value Ref Range   ABO/RH(D) O  NEG    Antibody Screen NEG    Sample Expiration 01/04/2015   Heparin level (unfractionated)     Status: Abnormal   Collection Time: 12/20/2014  4:14 AM  Result Value Ref Range   Heparin Unfractionated 0.81 (H) 0.30 - 0.70 IU/mL    Comment:        IF HEPARIN RESULTS ARE BELOW EXPECTED VALUES, AND PATIENT DOSAGE HAS BEEN CONFIRMED, SUGGEST FOLLOW UP TESTING OF ANTITHROMBIN III LEVELS.   ABO/Rh     Status: None   Collection Time: 12/15/2014  4:14 AM  Result Value Ref Range   ABO/RH(D) O NEG   Lactic acid, plasma     Status: Abnormal   Collection Time: 01/04/2015  7:00 AM  Result Value Ref Range   Lactic Acid, Venous 2.5 (HH) 0.5 - 2.0 mmol/L    Comment: CRITICAL RESULT CALLED TO, READ BACK BY AND VERIFIED WITH: TOLERMRN [id number] MCCAULEG   Troponin I (q 6hr x 3)     Status: Abnormal   Collection Time: 12/17/2014 12:49 PM  Result Value Ref Range   Troponin I >65.00 (HH) <0.031 ng/mL    Comment:        POSSIBLE MYOCARDIAL ISCHEMIA. SERIAL TESTING RECOMMENDED. CRITICAL VALUE NOTED.  VALUE IS CONSISTENT WITH PREVIOUSLY REPORTED AND CALLED VALUE.     Ct Angio Chest Pe W/cm &/or Wo Cm  12/16/2014  CLINICAL DATA:  Status post fall onto back. Dizziness, nausea and vomiting. Productive cough and left rib pain. Initial encounter. EXAM: CT ANGIOGRAPHY CHEST WITH CONTRAST TECHNIQUE: Multidetector CT imaging of the chest was performed using the standard protocol during bolus administration of intravenous contrast. Multiplanar CT image reconstructions and MIPs were obtained to evaluate the vascular anatomy. CONTRAST:  178m OMNIPAQUE IOHEXOL 350 MG/ML SOLN COMPARISON:  Chest radiograph performed earlier today at 9:12 p.m. FINDINGS: There is no evidence of pulmonary embolus. Fluffy airspace opacification is noted within much of the right lung, and minimally within the left lung, with trace bilateral effusions. This may reflect asymmetric pulmonary edema or pneumonia. There is no evidence of  pneumothorax. No masses are identified; no abnormal focal contrast enhancement is seen. Scattered coronary artery calcifications are seen. A moderate to large hiatal hernia is noted. A mildly prominent 1.3 cm azygoesophageal recess node is seen. A 1.2 cm right paratracheal node is noted. No pericardial effusion is identified. The great vessels are grossly unremarkable in appearance. No axillary lymphadenopathy is seen. The thyroid gland is unremarkable in appearance. The visualized portions of the liver and spleen are unremarkable. The visualized portions of the pancreas, gallbladder, stomach, adrenal glands and kidneys are within normal limits. No acute osseous abnormalities are seen. Review of the MIP images confirms the above findings. IMPRESSION: 1. No evidence of pulmonary embolus. 2. Fluffy airspace opacification within much of the right lung, and minimally within the left lung, with trace bilateral pleural  effusions. This may reflect asymmetric pulmonary edema or pneumonia. 3. Scattered coronary artery calcifications seen. 4. Moderate to large hiatal hernia noted. 5. Mildly prominent mediastinal nodes seen. Electronically Signed   By: Garald Balding M.D.   On: 12/15/2014 21:58   Dg Chest Port 1 View  12/30/2014  CLINICAL DATA:  Septic shock, acute onset.  Initial encounter. EXAM: PORTABLE CHEST 1 VIEW COMPARISON:  Chest radiograph performed 12/10/2014 FINDINGS: The patient's endotracheal tube is seen ending 4 cm above the carina. An enteric tube is noted extending below the diaphragm. A right IJ line is noted ending about the cavoatrial junction. Bilateral airspace opacification is again noted, right greater than left, mildly worsened from the prior study. This is concerning for multifocal pneumonia, though asymmetric interstitial edema might have a similar appearance. No definite pleural effusion or pneumothorax is seen. The cardiomediastinal silhouette is borderline normal in size. No acute osseous  abnormalities are identified. IMPRESSION: 1. Endotracheal tube seen ending 4 cm above the carina. 2. Bilateral airspace opacification, right greater than left, mildly worsened from the prior study. This is concerning for multifocal pneumonia, though asymmetric interstitial edema might have a similar appearance. Electronically Signed   By: Garald Balding M.D.   On: 12/26/2014 06:01   Dg Chest Portable 1 View  12/25/2014  CLINICAL DATA:  Post intubation. History of stroke, hypertension, heart murmur, left heart catheterization, chronic kidney disease, renal disorder. EXAM: PORTABLE CHEST 1 VIEW COMPARISON:  CT chest 12/29/2014.  Chest 12/23/2014. FINDINGS: Interval placement of an endotracheal tube with tip measuring 3.7 cm above the carina. Enteric tube is been placed with tip in the left upper quadrant consistent with location in the upper stomach. Persistent diffuse airspace disease in the right lung and in the left lower lung. Esophageal hiatal hernia behind the heart. Normal heart size and pulmonary vascularity. IMPRESSION: Endotracheal tube placed with tip measuring 3.7 cm above the carina. Persistent bilateral airspace disease in the lungs. Electronically Signed   By: Lucienne Capers M.D.   On: 12/23/2014 23:26   Dg Chest Portable 1 View  12/15/2014  CLINICAL DATA:  Hypoxemia for 3 weeks.  Worsening rib pain. EXAM: PORTABLE CHEST 1 VIEW COMPARISON:  06/08/2012 FINDINGS: Normal cardiac silhouette. There is new airspace disease in the RIGHT lower lobe. LEFT lung is clear. No pneumothorax. IMPRESSION: RIGHT lower lobe lobar pneumonia. Followup PA and lateral chest X-ray is recommended in 3-4 weeks following trial of antibiotic therapy to ensure resolution and exclude underlying malignancy. Electronically Signed   By: Suzy Bouchard M.D.   On: 12/20/2014 21:28    ROS Blood pressure 89/60, pulse 75, temperature 100.9 F (38.3 C), temperature source Other (Comment), resp. rate 18, height _0  (1.753  m), weight 70 kg (154 lb 5.2 oz), SpO2 98 %. Physical Exam Physical Examination: General appearance - on vent sedated Mental status - sleepy moves all extrem,nonfocal facies, and CN,  acknowleges some questions Eyes - pupils equal and reactive, extraocular eye movements intact Mouth - mucous membranes moist, pharynx normal without lesions Neck - adenopathy noted PCL, R IJ cath Lymphatics - posterior cervical nodes Chest - rhonchi noted bilat Heart - S1 and S2 normal Abdomen - non tender but very few bs Neurological - Cn nonfocal 3-12, moves all extrem to stim Extremities - AVF LUA Skin - Pale  Assessment/Plan: 1 CAD s/p MI per Dr. Einar Gip 2 ESRD: no emergent need for HD, will follow chem, may need CRRT with low bps 3 Hypertension: not an issue  now 4. Anemia of ESRD: good Hb 5. Metabolic Bone Disease: will follow 6 VDRF pneu 7 Pneu on AB P follow chem, vent , get meds back as indic  Jalen Daluz L 12/13/2014, 1:41 PM

## 2015-01-01 NOTE — Progress Notes (Signed)
CRITICAL VALUE ALERT  Critical value received:  Lactic Acid 3.8  Date of notification:  12/08/2014  Time of notification:  [id number]  Critical value read back:Yes.    Nurse who received alert:  Vic Blackbird RN  MD notified (1st page): eLink MD  Time of first page:  [phone]  Responding MD:  Jimmy Footman, MD  Time MD responded:  [phone]

## 2015-01-01 NOTE — Progress Notes (Signed)
Initial Nutrition Assessment  DOCUMENTATION CODES:   Not applicable  INTERVENTION:  Initiate Vital 1.2  @ 20 ml/hr via NGT and increase by 10 ml every 4hr hours to goal rate of 70 ml/hr.   Tube feeding regimen provides 2016 kcal (100% of needs), 126 grams of protein, and 1362 ml of H2O.    NUTRITION DIAGNOSIS:   Inadequate oral intake related to inability to eat as evidenced by NPO status.   GOAL:   Provide needs based on ASPEN/SCCM guidelines   MONITOR:   Vent status, TF tolerance, Labs, Weight trends  REASON FOR ASSESSMENT:   Ventilator    ASSESSMENT: Patient is currently intubated on ventilator support . Respiratory failure and new MI. He has end-stage renal disease but is not on dialysis. Fistula present. No family present to provide verbal hx. His weight records show loss of 9% over the past 1+ years.  Nutrition focused exam deferred-pt unable to particitpate MV: 15.1 L/min Temp (24hrs), Avg:100.2 F (37.9 C), Min:98.6 F (37 C), Max:101.1 F (38.4 C)  Propofol: 0 ml/hr   Abnormal Labs: BUN-26, Cr 5.40, Mag-2.5, Phos 4.8, glucose 152.  Diet Order:  Diet NPO time specified  Skin:   intact  Last BM:   11/26  Height:   Ht Readings from Last 1 Encounters:  12/16/2014 5\' 9"  (1.753 m)    Weight:   Wt Readings from Last 1 Encounters:  12/18/2014 154 lb 5.2 oz (70 kg)    Ideal Body Weight:  72.7 kg  BMI:  Body mass index is 22.78 kg/(m^2).  Estimated Nutritional Needs:   Kcal:  2028  Protein:  105-130 gr  Fluid:  2.0 liters daily  EDUCATION NEEDS:   No education needs identified at this time  Colman Cater MS,RD,CSG,LDN Office: [phone] Pager: [phone]

## 2015-01-01 NOTE — Progress Notes (Signed)
Patient becoming progressively more hypotensive throughout the night. Propofol gtt turned off and neo started per orders. Decision made to place central line. Ancil Linsey, MD @ bedside to place line. Line placed as emergent. Time out performed.

## 2015-01-02 ENCOUNTER — Inpatient Hospital Stay (HOSPITAL_COMMUNITY): Payer: Medicare Other

## 2015-01-02 DIAGNOSIS — J9690 Respiratory failure, unspecified, unspecified whether with hypoxia or hypercapnia: Secondary | ICD-10-CM | POA: Insufficient documentation

## 2015-01-02 DIAGNOSIS — J9601 Acute respiratory failure with hypoxia: Secondary | ICD-10-CM

## 2015-01-02 DIAGNOSIS — J9600 Acute respiratory failure, unspecified whether with hypoxia or hypercapnia: Secondary | ICD-10-CM

## 2015-01-02 LAB — COMPREHENSIVE METABOLIC PANEL
ALBUMIN: 2.3 g/dL — AB (ref 3.5–5.0)
ALK PHOS: 49 U/L (ref 38–126)
ALT: 42 U/L (ref 17–63)
AST: 152 U/L — ABNORMAL HIGH (ref 15–41)
Anion gap: 13 (ref 5–15)
BUN: 44 mg/dL — ABNORMAL HIGH (ref 6–20)
CALCIUM: 8 mg/dL — AB (ref 8.9–10.3)
CHLORIDE: 102 mmol/L (ref 101–111)
CO2: 22 mmol/L (ref 22–32)
CREATININE: 6.57 mg/dL — AB (ref 0.61–1.24)
GFR calc non Af Amer: 8 mL/min — ABNORMAL LOW (ref >60)
GFR, EST AFRICAN AMERICAN: 9 mL/min — AB (ref >60)
GLUCOSE: 149 mg/dL — AB (ref 65–99)
Potassium: 4 mmol/L (ref 3.5–5.1)
SODIUM: 137 mmol/L (ref 135–145)
Total Bilirubin: 0.9 mg/dL (ref 0.3–1.2)
Total Protein: 5.5 g/dL — ABNORMAL LOW (ref 6.5–8.1)

## 2015-01-02 LAB — CBC
HEMATOCRIT: 31.3 % — AB (ref 39.0–52.0)
HEMOGLOBIN: 10.5 g/dL — AB (ref 13.0–17.0)
MCH: 32.9 pg (ref 26.0–34.0)
MCHC: 33.5 g/dL (ref 30.0–36.0)
MCV: 98.1 fL (ref 78.0–100.0)
Platelets: 224 10*3/uL (ref 150–400)
RBC: 3.19 MIL/uL — AB (ref 4.22–5.81)
RDW: 16.7 % — ABNORMAL HIGH (ref 11.5–15.5)
WBC: 18.4 10*3/uL — AB (ref 4.0–10.5)

## 2015-01-02 LAB — URINE CULTURE
Culture: NO GROWTH
SPECIAL REQUESTS: NORMAL

## 2015-01-02 LAB — GLUCOSE, CAPILLARY
Glucose-Capillary: 130 mg/dL — ABNORMAL HIGH (ref 65–99)
Glucose-Capillary: 150 mg/dL — ABNORMAL HIGH (ref 65–99)

## 2015-01-02 LAB — HEPATITIS C ANTIBODY (REFLEX)

## 2015-01-02 LAB — HEPATITIS B SURFACE ANTIBODY,QUALITATIVE: Hep B S Ab: NONREACTIVE

## 2015-01-02 LAB — HEPATITIS B CORE ANTIBODY, IGM: HEP B C IGM: NEGATIVE

## 2015-01-02 LAB — HCV COMMENT:

## 2015-01-02 LAB — PHOSPHORUS: PHOSPHORUS: 5.3 mg/dL — AB (ref 2.5–4.6)

## 2015-01-02 LAB — CORTISOL: Cortisol, Plasma: 56.3 ug/dL

## 2015-01-02 LAB — HEPATITIS B SURFACE ANTIGEN: Hepatitis B Surface Ag: NEGATIVE

## 2015-01-02 MED ORDER — PRISMASOL BGK 4/2.5 32-4-2.5 MEQ/L IV SOLN
INTRAVENOUS | Status: DC
  Filled 2015-01-02 (×2): qty 5000

## 2015-01-02 MED ORDER — HEPARIN SODIUM (PORCINE) 1000 UNIT/ML DIALYSIS
1000.0000 [IU] | INTRAMUSCULAR | Status: DC | PRN
  Filled 2015-01-02: qty 6

## 2015-01-02 MED ORDER — PANTOPRAZOLE SODIUM 40 MG PO PACK: 20.0000 mg | PACK | Freq: Every day | ORAL | Status: DC

## 2015-01-02 MED ORDER — HEPARIN BOLUS VIA INFUSION (CRRT)
1000.0000 [IU] | INTRAVENOUS | Status: DC | PRN
  Filled 2015-01-02: qty 1000

## 2015-01-02 MED ORDER — VANCOMYCIN HCL IN DEXTROSE 750-5 MG/150ML-% IV SOLN
750.0000 mg | INTRAVENOUS | Status: DC
  Filled 2015-01-02: qty 150

## 2015-01-02 MED ORDER — HEPARIN SODIUM (PORCINE) 5000 UNIT/ML IJ SOLN
250.0000 [IU]/h | INTRAMUSCULAR | Status: DC
Start: 2015-01-02 — End: 2015-01-02
  Filled 2015-01-02: qty 2

## 2015-01-02 MED ORDER — SODIUM BICARBONATE 8.4 % IV SOLN
INTRAVENOUS | Status: DC
Start: 2015-01-02 — End: 2015-01-02
  Filled 2015-01-02: qty 200

## 2015-01-02 MED ORDER — SODIUM CHLORIDE 0.9 % FOR CRRT
INTRAVENOUS_CENTRAL | Status: DC | PRN
  Filled 2015-01-02: qty 1000

## 2015-01-02 MED ORDER — PRISMASOL BGK 4/2.5 32-4-2.5 MEQ/L IV SOLN
INTRAVENOUS | Status: DC
  Filled 2015-01-02 (×5): qty 5000

## 2015-01-02 MED ORDER — PIPERACILLIN-TAZOBACTAM IN DEX 2-0.25 GM/50ML IV SOLN
2.2500 g | Freq: Four times a day (QID) | INTRAVENOUS | Status: DC
  Filled 2015-01-02 (×2): qty 50

## 2015-01-03 ENCOUNTER — Encounter (HOSPITAL_COMMUNITY): Payer: Self-pay | Admitting: Cardiology

## 2015-01-03 LAB — POCT I-STAT 3, ART BLOOD GAS (G3+)
Acid-base deficit: 2 mmol/L (ref 0.0–2.0)
Bicarbonate: 22 mEq/L (ref 20.0–24.0)
O2 SAT: 98 %
PCO2 ART: 32.5 mmHg — AB (ref 35.0–45.0)
PO2 ART: 107 mmHg — AB (ref 80.0–100.0)
TCO2: 23 mmol/L (ref 0–100)
pH, Arterial: 7.439 (ref 7.350–7.450)

## 2015-01-03 MED FILL — Verapamil HCl IV Soln 2.5 MG/ML: INTRAVENOUS | Qty: 2 | Status: AC

## 2015-01-03 MED FILL — Nitroglycerin IV Soln 100 MCG/ML in D5W: INTRA_ARTERIAL | Qty: 10 | Status: AC

## 2015-01-03 MED FILL — Heparin Sodium (Porcine) 2 Unit/ML in Sodium Chloride 0.9%: INTRAMUSCULAR | Qty: 1000 | Status: AC

## 2015-01-03 MED FILL — Medication: Qty: 1 | Status: AC

## 2015-01-04 LAB — POCT ACTIVATED CLOTTING TIME: Activated Clotting Time: 134 seconds

## 2015-01-04 LAB — POCT I-STAT 3, VENOUS BLOOD GAS (G3P V)
Acid-base deficit: 2 mmol/L (ref 0.0–2.0)
BICARBONATE: 22.6 meq/L (ref 20.0–24.0)
O2 Saturation: 57 %
PCO2 VEN: 35.5 mmHg — AB (ref 45.0–50.0)
PO2 VEN: 29 mmHg — AB (ref 30.0–45.0)
TCO2: 24 mmol/L (ref 0–100)
pH, Ven: 7.412 — ABNORMAL HIGH (ref 7.250–7.300)

## 2015-01-04 LAB — CULTURE, RESPIRATORY W GRAM STAIN: Culture: NO GROWTH

## 2015-01-04 LAB — CULTURE, RESPIRATORY

## 2015-01-05 ENCOUNTER — Telehealth: Payer: Self-pay

## 2015-01-05 LAB — CULTURE, BLOOD (ROUTINE X 2)
CULTURE: NO GROWTH
Culture: NO GROWTH

## 2015-01-05 MED FILL — Medication: Qty: 1 | Status: AC

## 2015-01-05 NOTE — Telephone Encounter (Signed)
On 01/05/2015 I received a death certificate from [facility] (orginal). The death certificate is for burial. The patient is a patient of Doctor Titus Mould. The death certificate will be taken to the [facility] 2H unit this am for signature. On 01-17-2015 I received the death certificate back from Doctor Titus Mould. I got the death certificate ready and called the funeral home to let them know that the death certificate is being mailed to the funeral home per their request.

## 2015-01-06 NOTE — Progress Notes (Signed)
PEA, Code Blue called, ACLS initiated

## 2015-01-06 NOTE — Progress Notes (Signed)
PULMONARY / CRITICAL CARE MEDICINE HISTORY AND PHYSICAL EXAMINATION   Name: Charles Zuniga MRN: [id number] DOB: Oct 15, 1945    ADMISSION DATE:  12/28/2014  PRIMARY SERVICE: PCCM  CHIEF COMPLAINT:  Weakness, chest pain  BRIEF PATIENT DESCRIPTION: 69 y/o man with ESRD withresp failure, h/o cad, AS concern pneumonia   SIGNIFICANT EVENTS / STUDIES:  CT-PA showing no PE, but infiltrate on R. 11/26 cath lab, severe AS, LAD open  LINES / TUBES: PIVs ETT 11/25>> NGT11/25>> Fem swan 11/26 Rt IJ 11/26>>>  CULTURES: Blood cx 11/25>> Urine cx 11/25>> Lower resp cx 11/25>>  ANTIBIOTICS: Vanc: 11/25>> Pip-tazo: 11/25>>  SUBJECTIVE:  Calm and cooperative  VITAL SIGNS: Temp:  [98.8 F (37.1 C)-101.1 F (38.4 C)] 99.3 F (37.4 C) (11/27 0700) Pulse Rate:  [64-295] 73 (11/27 0700) Resp:  [0-33] 24 (11/27 0700) BP: (70-137)/(44-80) 87/63 mmHg (11/27 0700) SpO2:  [0 %-100 %] 93 % (11/27 0700) FiO2 (%):  [40 %-50 %] 40 % (11/27 0400) Weight:  [153 lb (69.4 kg)-154 lb 5.2 oz (70 kg)] 153 lb (69.4 kg) (11/27 0351) HEMODYNAMICS: PAP: (38-70)/(16-35) 60/28 mmHg CVP:  [2 mmHg-9 mmHg] 5 mmHg PCWP:  [13 mmHg-23 mmHg] 23 mmHg CO:  [4 L/min-4.6 L/min] 4 L/min CI:  [2.1 L/min/m2-4.9 L/min/m2] 2.1 L/min/m2 VENTILATOR SETTINGS: Vent Mode:  [-] PRVC FiO2 (%):  [40 %-50 %] 40 % Set Rate:  [15 bmp] 15 bmp Vt Set:  [530 mL] 530 mL PEEP:  [5 cmH20] 5 cmH20 Plateau Pressure:  [16 cmH20-24 cmH20] 16 cmH20 INTAKE / OUTPUT: Intake/Output      11/26 0701 - 11/27 0700 11/27 0701 - 11/28 0700   I.V. (mL/kg) 1109.7 (16)    NG/GT 410    IV Piggyback 300    Total Intake(mL/kg) 1819.7 (26.2)    Urine (mL/kg/hr) 370 (0.2)    Total Output 370     Net +1449.7            PHYSICAL EXAMINATION: General:  Elderly man intubated lying in bed,off sedation, alert and cooperative Neuro:  Awake. Mae x 4 to command, nods to questions HEENT:  jvd , line clean Neck: jvd Cardiovascular:  s1 s2 Regular  rate, rhythm Lungs:  ronchi rt greater left Abdomen:  Soft, nontender, no r/g Musculoskeletal:  No swollen joints Skin:  No rashes  LABS:  CBC  Recent Labs Lab 12/12/2014 2059 12/10/2014 2107 12/19/2014 0414 01-22-2015 0400  WBC 18.5*  --  17.4* 18.4*  HGB 12.9* 13.6 12.0* 10.5*  HCT 38.3* 40.0 36.9* 31.3*  PLT 319  --  262 224   Coag's No results for input(s): APTT, INR in the last 168 hours. BMET  Recent Labs Lab 12/21/2014 0414 12/08/2014 1710 January 22, 2015 0400  NA 137 136 137  K 4.3 4.2 4.0  CL 100* 102 102  CO2 22 20* 22  BUN 26* 36* 44*  CREATININE 5.40* 5.82* 6.57*  GLUCOSE 152* 133* 149*   Electrolytes  Recent Labs Lab 12/10/2014 2016  12/10/2014 0414 01/04/2015 1710 January 22, 2015 0400  CALCIUM  --   < > 8.0* 7.7* 8.0*  MG 1.9  --  2.5*  --   --   PHOS  --   --  4.8* 4.1 5.3*  < > = values in this interval not displayed. Sepsis Markers  Recent Labs Lab 12/10/2014 0036 01/04/2015 0414 12/15/2014 0700  LATICACIDVEN 4.83* 3.8* 2.5*   ABG  Recent Labs Lab 12/28/2014 2109 12/27/2014 2325 12/22/2014 0400  PHART 7.531* 7.388 7.492*  PCO2ART 29.5*  34.7* 27.6*  PO2ART 70.6* 111.0* 65.5*   Liver Enzymes  Recent Labs Lab 12/14/2014 0414 12/25/2014 1710 13-Jan-2015 0400  AST 160*  --  152*  ALT 27  --  42  ALKPHOS 58  --  49  BILITOT 1.2  --  0.9  ALBUMIN 2.7* 2.3* 2.3*   Cardiac Enzymes  Recent Labs Lab 12/07/2014 0414 12/17/2014 1249 12/27/2014 1709  TROPONINI 60.89* >65.00* >65.00*   Glucose  Recent Labs Lab 12/20/2014 0228 01/05/2015 2021 Jan 13, 2015 01-13-15 0357  GLUCAP 184* 139* 130* 150*    Imaging Ct Angio Chest Pe W/cm &/or Wo Cm  12/30/2014  CLINICAL DATA:  Status post fall onto back. Dizziness, nausea and vomiting. Productive cough and left rib pain. Initial encounter. EXAM: CT ANGIOGRAPHY CHEST WITH CONTRAST TECHNIQUE: Multidetector CT imaging of the chest was performed using the standard protocol during bolus administration of intravenous contrast. Multiplanar  CT image reconstructions and MIPs were obtained to evaluate the vascular anatomy. CONTRAST:  135mL OMNIPAQUE IOHEXOL 350 MG/ML SOLN COMPARISON:  Chest radiograph performed earlier today at 9:12 p.m. FINDINGS: There is no evidence of pulmonary embolus. Fluffy airspace opacification is noted within much of the right lung, and minimally within the left lung, with trace bilateral effusions. This may reflect asymmetric pulmonary edema or pneumonia. There is no evidence of pneumothorax. No masses are identified; no abnormal focal contrast enhancement is seen. Scattered coronary artery calcifications are seen. A moderate to large hiatal hernia is noted. A mildly prominent 1.3 cm azygoesophageal recess node is seen. A 1.2 cm right paratracheal node is noted. No pericardial effusion is identified. The great vessels are grossly unremarkable in appearance. No axillary lymphadenopathy is seen. The thyroid gland is unremarkable in appearance. The visualized portions of the liver and spleen are unremarkable. The visualized portions of the pancreas, gallbladder, stomach, adrenal glands and kidneys are within normal limits. No acute osseous abnormalities are seen. Review of the MIP images confirms the above findings. IMPRESSION: 1. No evidence of pulmonary embolus. 2. Fluffy airspace opacification within much of the right lung, and minimally within the left lung, with trace bilateral pleural effusions. This may reflect asymmetric pulmonary edema or pneumonia. 3. Scattered coronary artery calcifications seen. 4. Moderate to large hiatal hernia noted. 5. Mildly prominent mediastinal nodes seen. Electronically Signed   By: Garald Balding M.D.   On: 12/20/2014 21:58   Dg Chest Port 1 View  12/24/2014  CLINICAL DATA:  Septic shock, acute onset.  Initial encounter. EXAM: PORTABLE CHEST 1 VIEW COMPARISON:  Chest radiograph performed 12/27/2014 FINDINGS: The patient's endotracheal tube is seen ending 4 cm above the carina. An enteric  tube is noted extending below the diaphragm. A right IJ line is noted ending about the cavoatrial junction. Bilateral airspace opacification is again noted, right greater than left, mildly worsened from the prior study. This is concerning for multifocal pneumonia, though asymmetric interstitial edema might have a similar appearance. No definite pleural effusion or pneumothorax is seen. The cardiomediastinal silhouette is borderline normal in size. No acute osseous abnormalities are identified. IMPRESSION: 1. Endotracheal tube seen ending 4 cm above the carina. 2. Bilateral airspace opacification, right greater than left, mildly worsened from the prior study. This is concerning for multifocal pneumonia, though asymmetric interstitial edema might have a similar appearance. Electronically Signed   By: Garald Balding M.D.   On: 01/01/2015 06:01   Dg Chest Portable 1 View  12/30/2014  CLINICAL DATA:  Post intubation. History of stroke, hypertension, heart murmur, left  heart catheterization, chronic kidney disease, renal disorder. EXAM: PORTABLE CHEST 1 VIEW COMPARISON:  CT chest 01/03/2015.  Chest 12/17/2014. FINDINGS: Interval placement of an endotracheal tube with tip measuring 3.7 cm above the carina. Enteric tube is been placed with tip in the left upper quadrant consistent with location in the upper stomach. Persistent diffuse airspace disease in the right lung and in the left lower lung. Esophageal hiatal hernia behind the heart. Normal heart size and pulmonary vascularity. IMPRESSION: Endotracheal tube placed with tip measuring 3.7 cm above the carina. Persistent bilateral airspace disease in the lungs. Electronically Signed   By: Lucienne Capers M.D.   On: 12/12/2014 23:26   Dg Chest Portable 1 View  12/25/2014  CLINICAL DATA:  Hypoxemia for 3 weeks.  Worsening rib pain. EXAM: PORTABLE CHEST 1 VIEW COMPARISON:  06/08/2012 FINDINGS: Normal cardiac silhouette. There is new airspace disease in the RIGHT  lower lobe. LEFT lung is clear. No pneumothorax. IMPRESSION: RIGHT lower lobe lobar pneumonia. Followup PA and lateral chest X-ray is recommended in 3-4 weeks following trial of antibiotic therapy to ensure resolution and exclude underlying malignancy. Electronically Signed   By: Suzy Bouchard M.D.   On: 12/24/2014 21:28    EKG:NEW LBBB CXR: Moderate infiltrate of the R lung.  ASSESSMENT / PLAN:  Active Problems:   Sepsis ([place])   Pneumonia   Acute respiratory failure with hypoxia (HCC)   PULMONARY A: Hypoxic Respiratory Failure- r/o cardiogenic in nature P:   pcxr in am  Neg balance as able, see renal Vent bundle Agitation controlled Check c x r today  CARDIOVASCULAR A: NSTEMI Hypotension, r/o cardiogenic shock AS Severe CAD P:    cath lab ,  swan Neo to map goal, Avoid beta agonists with AS Diona Fanti will be preload dependent with AS, with caution neg balance , pending pressors needs Get cortisol, ordered  Heparin is off HD monitoring  RENAL A: ESRD on HD P:   Renal following, 11/27 reqeusting HD catheter be placed for cvvh  GASTROINTESTINAL A: NPO, shock liver P:    TF   ppi LFT nsc  HEMATOLOGIC A: Oozing line, at risk DVT P:   Hep drip off Sub q hep hold until oozing improved, thombi pad in place scd ddavp not needed, avoid with cad as able  INFECTIOUS A: R/o PNA rt, r/o UTI P:   Pip-tazo+vanc assess BC  ENDOCRINE A: R/o ai P:   Get cortisol, ordered 11/27 0800, currently off pressors ssi  NEUROLOGIC A: Vent dyschrony, agitation , improved 11/27. Awake and follows commands, not on continuous sedation P:   PRN fet, dc diprivan Daily Phylliss Blakes Minor ACNP Maryanna Shape PCCM Pager [phone] till 3 pm If no answer page [phone] 2015-01-18, 7:46 AM     STAFF NOTE: I, Merrie Roof, MD FACP have personally reviewed patient's available data, including medical history, events of note, physical examination and test results as part of  my evaluation. I have discussed with resident/NP and other care providers such as pharmacist, RN and RRT. In addition, I personally evaluated patient and elicited key findings of: pcxr improved edema, last abg reviewed, may reduce MV, will place HD cath and cvhhd, no prop, using fent, remains culture neg, consider narrow abx, follow swan wedge, avoiding bta agonists with AS, neo as would be choice  UPDATE: coded PEA, treated empiric hyperkalemia, which is unlikely to be cause, likely sudden cardiac death related to critical AS, expired, called sisters in , will update them of  loss The patient is critically ill with multiple organ systems failure and requires high complexity decision making for assessment and support, frequent evaluation and titration of therapies, application of advanced monitoring technologies and extensive interpretation of multiple databases.   Critical Care Time devoted to patient care services described in this note is 30 Minutes. This time reflects time of care of this signee: Merrie Roof, MD FACP. This critical care time does not reflect procedure time, or teaching time or supervisory time of PA/NP/Med student/Med Resident etc but could involve care discussion time. Rest per NP/medical resident whose note is outlined above and that I agree with   Lavon Paganini. Titus Mould, MD, [place] Pgr: [facility] 11-Jan-2015 9:45 AM

## 2015-01-06 NOTE — Progress Notes (Signed)
Utilization review completed.  

## 2015-01-06 NOTE — Progress Notes (Signed)
Code terminated per Dr Titus Mould, [place]. Family contacted and in route.

## 2015-01-06 NOTE — Progress Notes (Signed)
RT called RN to room after pt reportedly c/o chest pain. When assessed, pt denied any pain or discomfort, but 12 lead EKG was brought in b/c HR >130 and pt's skin was cool/clammy.

## 2015-01-06 NOTE — Progress Notes (Signed)
58mcg fentanyl given, nurse and RT at bedside. Pt continues to have increased HR and RR, O2 sats maintaining in low-mid 80's; Richardson Landry Minor, ARNP notified and at bedside. Order for more sedation received.

## 2015-01-06 NOTE — Procedures (Signed)
Arterial Catheter Insertion Procedure Note during arrest Charles Zuniga [id number] 10-16-1945  Procedure: Insertion of Arterial Catheter  Indications: Blood pressure monitoring  Procedure Details Consent: Unable to obtain consent because of emergent medical necessity. Time Out: Verified patient identification, verified procedure, site/side was marked, verified correct patient position, special equipment/implants available, medications/allergies/relevent history reviewed, required imaging and test results available.  Performed  Maximum sterile technique was used including antiseptics, cap, gloves, gown, hand hygiene, mask and sheet. Skin prep: Chlorhexidine; local anesthetic administered 20 gauge catheter was inserted into right femoral artery using the Seldinger technique.  Evaluation Blood flow poor; BP tracing poor Complications: No apparent complications.  Limited flow after placement, dc'ed catheter  [name] J. 01-31-15

## 2015-01-06 NOTE — Procedures (Signed)
CPR  ACLS followed Also treated empiric hyperkalemia PEA to asytole Coded twice,  A line attempted Did not survive Family being contacted  Lavon Paganini. Titus Mould, MD, [place] Pgr: [facility]

## 2015-01-06 NOTE — Progress Notes (Signed)
ANTIBIOTIC CONSULT NOTE - FOLLOW UP  Pharmacy Consult:  Vancomycin Indication:  PNA  Allergies  Allergen Reactions  . Clonidine Derivatives Other (See Comments)    Hallucinations, anxious,insomnia  . Sulfa Antibiotics Other (See Comments)    Per dialysis center    Patient Measurements: Height: 5\' 9"  (175.3 cm) Weight: 153 lb (69.4 kg) IBW/kg (Calculated) : 70.7  Vital Signs: Temp: 99.3 F (37.4 C) (11/27 0700) Temp Source: Core (Comment) (11/27 0400) BP: 87/63 mmHg (11/27 0700) Pulse Rate: 73 (11/27 0700)  Labs:  Recent Labs  12/22/2014 2059 12/08/2014 2107 01/04/2015 0414 12/07/2014 1249 12/31/2014 1709 01/01/15 1710 07-Jan-2015 0400  HGB 12.9* 13.6 12.0*  --   --   --  10.5*  HCT 38.3* 40.0 36.9*  --   --   --  31.3*  PLT 319  --  262  --   --   --  224  HEPARINUNFRC  --   --  0.81*  --   --   --   --   CREATININE 5.05* 5.00* 5.40*  --   --  5.82* 6.57*  TROPONINI 6.60*  --  60.89* >65.00* >65.00*  --   --     Estimated Creatinine Clearance: 10.3 mL/min (by C-G formula based on Cr of 6.57).     Assessment: 15 YOM with PNA to continue on vancomycin and Zosyn.  Patient has a history of ESRD on MWF HD PTA (last session 12/19/2014), now to start CRRT given hypotension.  Vanc 11/25 >> Zosyn 11/25 >>  11/25 BCx x2 - NGTD 11/26 UCx - 11/26 Hep B - negative 11/26 TA - GPR on Gram stain   Goal of Therapy:  Vanc trough 15-20 mcg/mL    Plan:  - Change vanc to 750mg  IV Q24H - Change Zosyn to 2.25gm IV Q6H, infuse over 30 min - Monitor CRRT tolerance/interruptions, clinical course, vanc level as indicated - Change PPI to PT - F/U resume home meds as appropriate    Josee Speece D. Mina Marble, PharmD, BCPS Pager:  [phone] 2015-01-07, 8:26 AM

## 2015-01-06 NOTE — Progress Notes (Signed)
Subjective: Interval History: has no complaint of,on vent but awake.  Objective: Vital signs in last 24 hours: Temp:  [98.8 F (37.1 C)-101.1 F (38.4 C)] 99.3 F (37.4 C) (11/27 0700) Pulse Rate:  [64-295] 73 (11/27 0700) Resp:  [0-33] 24 (11/27 0700) BP: (70-137)/(44-80) 87/63 mmHg (11/27 0700) SpO2:  [0 %-100 %] 93 % (11/27 0700) FiO2 (%):  [40 %-50 %] 40 % (11/27 0400) Weight:  [69.4 kg (153 lb)-70 kg (154 lb 5.2 oz)] 69.4 kg (153 lb) (11/27 0351) Weight change: 1.96 kg (4 lb 5.2 oz)  Intake/Output from previous day: 11/26 0701 - 11/27 0700 In: 1819.7 [I.V.:1109.7; NG/GT:410; IV Piggyback:300] Out: 370 [Urine:370] Intake/Output this shift:    General appearance: cooperative, no distress, pale and slowed mentation Resp: rales bibasilar and rhonchi bilaterally Cardio: S1, S2 normal and systolic murmur: holosystolic 2/6, blowing at apex GI: soft, non-tender; bowel sounds normal; no masses,  no organomegaly Extremities: AVF LUA B&T  Lab Results:  Recent Labs  12/08/2014 0414 January 08, 2015 0400  WBC 17.4* 18.4*  HGB 12.0* 10.5*  HCT 36.9* 31.3*  PLT 262 224   BMET:  Recent Labs  12/09/2014 1710 2015-01-08 0400  NA 136 137  K 4.2 4.0  CL 102 102  CO2 20* 22  GLUCOSE 133* 149*  BUN 36* 44*  CREATININE 5.82* 6.57*  CALCIUM 7.7* 8.0*   No results for input(s): PTH in the last 72 hours. Iron Studies: No results for input(s): IRON, TIBC, TRANSFERRIN, FERRITIN in the last 72 hours.  Studies/Results: Ct Angio Chest Pe W/cm &/or Wo Cm  12/12/2014  CLINICAL DATA:  Status post fall onto back. Dizziness, nausea and vomiting. Productive cough and left rib pain. Initial encounter. EXAM: CT ANGIOGRAPHY CHEST WITH CONTRAST TECHNIQUE: Multidetector CT imaging of the chest was performed using the standard protocol during bolus administration of intravenous contrast. Multiplanar CT image reconstructions and MIPs were obtained to evaluate the vascular anatomy. CONTRAST:  125mL OMNIPAQUE  IOHEXOL 350 MG/ML SOLN COMPARISON:  Chest radiograph performed earlier today at 9:12 p.m. FINDINGS: There is no evidence of pulmonary embolus. Fluffy airspace opacification is noted within much of the right lung, and minimally within the left lung, with trace bilateral effusions. This may reflect asymmetric pulmonary edema or pneumonia. There is no evidence of pneumothorax. No masses are identified; no abnormal focal contrast enhancement is seen. Scattered coronary artery calcifications are seen. A moderate to large hiatal hernia is noted. A mildly prominent 1.3 cm azygoesophageal recess node is seen. A 1.2 cm right paratracheal node is noted. No pericardial effusion is identified. The great vessels are grossly unremarkable in appearance. No axillary lymphadenopathy is seen. The thyroid gland is unremarkable in appearance. The visualized portions of the liver and spleen are unremarkable. The visualized portions of the pancreas, gallbladder, stomach, adrenal glands and kidneys are within normal limits. No acute osseous abnormalities are seen. Review of the MIP images confirms the above findings. IMPRESSION: 1. No evidence of pulmonary embolus. 2. Fluffy airspace opacification within much of the right lung, and minimally within the left lung, with trace bilateral pleural effusions. This may reflect asymmetric pulmonary edema or pneumonia. 3. Scattered coronary artery calcifications seen. 4. Moderate to large hiatal hernia noted. 5. Mildly prominent mediastinal nodes seen. Electronically Signed   By: Garald Balding M.D.   On: 12/30/2014 21:58   Dg Chest Port 1 View  12/31/2014  CLINICAL DATA:  Septic shock, acute onset.  Initial encounter. EXAM: PORTABLE CHEST 1 VIEW COMPARISON:  Chest radiograph  performed 12/26/2014 FINDINGS: The patient's endotracheal tube is seen ending 4 cm above the carina. An enteric tube is noted extending below the diaphragm. A right IJ line is noted ending about the cavoatrial junction.  Bilateral airspace opacification is again noted, right greater than left, mildly worsened from the prior study. This is concerning for multifocal pneumonia, though asymmetric interstitial edema might have a similar appearance. No definite pleural effusion or pneumothorax is seen. The cardiomediastinal silhouette is borderline normal in size. No acute osseous abnormalities are identified. IMPRESSION: 1. Endotracheal tube seen ending 4 cm above the carina. 2. Bilateral airspace opacification, right greater than left, mildly worsened from the prior study. This is concerning for multifocal pneumonia, though asymmetric interstitial edema might have a similar appearance. Electronically Signed   By: Garald Balding M.D.   On: 12/08/2014 06:01   Dg Chest Portable 1 View  12/16/2014  CLINICAL DATA:  Post intubation. History of stroke, hypertension, heart murmur, left heart catheterization, chronic kidney disease, renal disorder. EXAM: PORTABLE CHEST 1 VIEW COMPARISON:  CT chest 12/15/2014.  Chest 12/15/2014. FINDINGS: Interval placement of an endotracheal tube with tip measuring 3.7 cm above the carina. Enteric tube is been placed with tip in the left upper quadrant consistent with location in the upper stomach. Persistent diffuse airspace disease in the right lung and in the left lower lung. Esophageal hiatal hernia behind the heart. Normal heart size and pulmonary vascularity. IMPRESSION: Endotracheal tube placed with tip measuring 3.7 cm above the carina. Persistent bilateral airspace disease in the lungs. Electronically Signed   By: Lucienne Capers M.D.   On: 12/10/2014 23:26   Dg Chest Portable 1 View  01/01/2015  CLINICAL DATA:  Hypoxemia for 3 weeks.  Worsening rib pain. EXAM: PORTABLE CHEST 1 VIEW COMPARISON:  06/08/2012 FINDINGS: Normal cardiac silhouette. There is new airspace disease in the RIGHT lower lobe. LEFT lung is clear. No pneumothorax. IMPRESSION: RIGHT lower lobe lobar pneumonia. Followup PA and  lateral chest X-ray is recommended in 3-4 weeks following trial of antibiotic therapy to ensure resolution and exclude underlying malignancy. Electronically Signed   By: Suzy Bouchard M.D.   On: 12/13/2014 21:28    I have reviewed the patient's current medications.  Assessment/Plan: 1 ESRD rapid rising pulm press.  Solute/K ok, mild acidemia . Will need to start CRRT to control vol. Bps low 2 CAD per cards 3 Anemia lower 4 HPTH 5 Pneu on AB ?sepsis P CRRT, follow Hb, bp , vol closely    LOS: 2 days   Katelynne Revak L 2015-01-05,8:04 AM

## 2015-01-06 DEATH — deceased

## 2015-02-06 NOTE — Discharge Summary (Signed)
NAMEABDOULIE, Charles Zuniga NO.:  [id number]  MEDICAL RECORD NO.:  [id number]  LOCATION:  2H14C                        FACILITY:  [facility]  PHYSICIAN:  Raylene Miyamoto, MD DATE OF BIRTH:  May 14, 1945  DATE OF ADMISSION:  12/26/2014 DATE OF DISCHARGE:  01-10-15                              DISCHARGE SUMMARY   DEATH SUMMARY  This is a 70 year old man with increasing respiratory failure, history of coronary artery disease, aortic stenosis, concerns as well for pneumonia.  significant events and studies including CT scan, showing no PE, but infiltrates on the right-hand side.  The patient underwent cardiac catheterization, with severe aortic stenosis with left anterior descending artery.  Lines and tubes; the patient receiving through an endotracheal tube on December 31, 2014, NG tube on December 31, 2014, had a femoral Swan placed on January 01, 2015, in Cath Lab as well as a right IJ on January 01, 2015.  The patient came out from the Cath Lab and continued to have refractory shock.  Cultures had been obtained. Vancomycin and Zosyn were provided empirically.  The patient was continued on ventilatory support.  Family was updated.  The patient had sudden cardiac arrest and expired despite aggressive measures including arterial line placement.  It seemed clearly that the patient had critical aortic stenosis causing his cardiac arrest.  The patient did not survive.  The family was notified.  FINAL DIAGNOSES UPON DEATH: 1. Severe critical aortic stenosis. 2. Coronary artery disease. 3. Cardiogenic shock. 4. Acute respiratory failure. 5. Pneumonia.     Raylene Miyamoto, MD     DJF/MEDQ  D:  01/18/2015  T:  01/19/2015  Job:  [id number]
